# Patient Record
Sex: Male | Born: 1975 | Race: Black or African American | Hispanic: No | Marital: Married | Smoking: Never smoker
Health system: Southern US, Community
[De-identification: ages and names within clinical notes are randomized; demographics above are authoritative.]

## PROBLEM LIST (undated history)

## (undated) DIAGNOSIS — E119 Type 2 diabetes mellitus without complications: Secondary | ICD-10-CM

## (undated) DIAGNOSIS — I10 Essential (primary) hypertension: Secondary | ICD-10-CM

## (undated) DIAGNOSIS — T7840XA Allergy, unspecified, initial encounter: Secondary | ICD-10-CM

## (undated) DIAGNOSIS — E785 Hyperlipidemia, unspecified: Secondary | ICD-10-CM

## (undated) DIAGNOSIS — L109 Pemphigus, unspecified: Secondary | ICD-10-CM

## (undated) DIAGNOSIS — M722 Plantar fascial fibromatosis: Secondary | ICD-10-CM

## (undated) HISTORY — DX: Hyperlipidemia, unspecified: E78.5

## (undated) HISTORY — DX: Pemphigus, unspecified: L10.9

## (undated) HISTORY — PX: FINGER SURGERY: SHX640

## (undated) HISTORY — DX: Essential (primary) hypertension: I10

## (undated) HISTORY — DX: Plantar fascial fibromatosis: M72.2

## (undated) HISTORY — DX: Allergy, unspecified, initial encounter: T78.40XA

## (undated) HISTORY — DX: Type 2 diabetes mellitus without complications: E11.9

---

## 2008-12-11 ENCOUNTER — Ambulatory Visit: Payer: Self-pay | Admitting: Internal Medicine

## 2008-12-11 DIAGNOSIS — E663 Overweight: Secondary | ICD-10-CM

## 2008-12-11 DIAGNOSIS — R635 Abnormal weight gain: Secondary | ICD-10-CM | POA: Insufficient documentation

## 2008-12-11 LAB — CONVERTED CEMR LAB
ALT: 24 units/L (ref 0–53)
AST: 22 units/L (ref 0–37)
Alkaline Phosphatase: 68 units/L (ref 39–117)
Basophils Absolute: 0.1 10*3/uL (ref 0.0–0.1)
Calcium: 9.6 mg/dL (ref 8.4–10.5)
Eosinophils Relative: 1.3 % (ref 0.0–5.0)
GFR calc non Af Amer: 124.12 mL/min (ref >60)
Glucose, Bld: 94 mg/dL (ref 70–99)
HCT: 40.3 % (ref 39.0–52.0)
HDL: 38.3 mg/dL — ABNORMAL LOW (ref >39.00)
Hemoglobin: 13.1 g/dL (ref 13.0–17.0)
LDL Cholesterol: 126 mg/dL — ABNORMAL HIGH (ref 0–99)
Lymphocytes Relative: 23.9 % (ref 12.0–46.0)
Monocytes Relative: 12.8 % — ABNORMAL HIGH (ref 3.0–12.0)
Neutro Abs: 2.3 10*3/uL (ref 1.4–7.7)
Platelets: 289 10*3/uL (ref 150.0–400.0)
Potassium: 4.2 meq/L (ref 3.5–5.1)
RDW: 14.5 % (ref 11.5–14.6)
Sodium: 140 meq/L (ref 135–145)
Total Bilirubin: 1 mg/dL (ref 0.3–1.2)
VLDL: 8.4 mg/dL (ref 0.0–40.0)
WBC: 4 10*3/uL — ABNORMAL LOW (ref 4.5–10.5)

## 2008-12-12 ENCOUNTER — Encounter: Payer: Self-pay | Admitting: Internal Medicine

## 2009-01-21 ENCOUNTER — Ambulatory Visit: Payer: Self-pay | Admitting: Internal Medicine

## 2009-06-11 ENCOUNTER — Ambulatory Visit: Payer: Self-pay | Admitting: Internal Medicine

## 2009-06-11 DIAGNOSIS — J309 Allergic rhinitis, unspecified: Secondary | ICD-10-CM | POA: Insufficient documentation

## 2009-06-11 DIAGNOSIS — J019 Acute sinusitis, unspecified: Secondary | ICD-10-CM

## 2010-01-31 HISTORY — PX: FOOT SURGERY: SHX648

## 2010-03-02 NOTE — Assessment & Plan Note (Signed)
Summary: COUGH--MUCUS--HEADACHES  STC   Vital Signs:  Patient profile:   35 year old male Height:      72 inches Weight:      317 pounds BMI:     43.15 O2 Sat:      98 % on Room air Temp:     98.6 degrees F oral Pulse rate:   91 / minute Pulse rhythm:   regular Resp:     16 per minute BP sitting:   130 / 82  (left arm) Cuff size:   large  Vitals Entered By: Rock Nephew CMA (Jun 11, 2009 3:39 PM)  Nutrition Counseling: Patient's BMI is greater than 25 and therefore counseled on weight management options.  O2 Flow:  Room air CC: congestion, cough, headache, post nasal drip, URI symptoms   Primary Care Provider:  Etta Grandchild MD  CC:  congestion, cough, headache, post nasal drip, and URI symptoms.  History of Present Illness:  URI Symptoms      This is a 35 year old man who presents with URI symptoms.  The symptoms began 4-8 weeks ago.  The severity is described as moderate.  The patient reports nasal congestion, purulent nasal discharge, sore throat, and productive cough, but denies earache and sick contacts.  The patient denies fever, stiff neck, dyspnea, wheezing, rash, vomiting, diarrhea, use of an antipyretic, and response to antipyretic.  The patient also reports sneezing, seasonal symptoms, and response to antihistamine.  The patient denies headache, muscle aches, and severe fatigue.  Risk factors for Strep sinusitis include unilateral facial pain, unilateral nasal discharge, poor response to decongestant, and double sickening.  The patient denies the following risk factors for Strep sinusitis: Strep exposure, tender adenopathy, and absence of cough.    Preventive Screening-Counseling & Management  Alcohol-Tobacco     Alcohol drinks/day: 0     Smoking Status: never  Hep-HIV-STD-Contraception     Hepatitis Risk: no risk noted     HIV Risk: no risk noted     STD Risk: no risk noted      Sexual History:  currently monogamous.        Drug Use:  no.        Blood  Transfusions:  no.    Current Medications (verified): 1)  Avelox 400 Mg Tabs (Moxifloxacin Hcl) .... One By Mouth Once Daily For 10 Days 2)  Clarinex 5 Mg Tabs (Desloratadine) .... One By Mouth Once Daily For Nasal Allergies  Allergies (verified): 1)  ! Vicodin  Past History:  Past Medical History: Reviewed history from 12/11/2008 and no changes required. Plantar fasciitis Pemphigus  Past Surgical History: Reviewed history from 12/11/2008 and no changes required. Denies surgical history  Family History: Reviewed history from 12/11/2008 and no changes required. Family History Diabetes 1st degree relative Family History Hypertension  Social History: Reviewed history from 12/11/2008 and no changes required. Occupation: Psychologist, forensic and coach Married Never Smoked Alcohol use-no Drug use-no Regular exercise-no  Review of Systems  The patient denies anorexia, fever, weight loss, decreased hearing, chest pain, peripheral edema, headaches, hemoptysis, abdominal pain, difficulty walking, enlarged lymph nodes, and angioedema.    Physical Exam  General:  alert, well-developed, well-nourished, well-hydrated, appropriate dress, normal appearance, healthy-appearing, and cooperative to examination.   Head:  normocephalic, atraumatic, no abnormalities observed, and no abnormalities palpated.   Ears:  R ear normal and L ear normal.   Nose:  no external deformity, no airflow obstruction, no intranasal foreign body, no nasal  polyps, no nasal mucosal lesions, no mucosal friability, no active bleeding or clots, nasal dischargemucosal pallor, mucosal edema, L maxillary sinus tenderness, and R maxillary sinus tenderness.   Mouth:  good dentition, pharynx pink and moist, no erythema, no exudates, no posterior lymphoid hypertrophy, no postnasal drip, no pharyngeal crowing, no lesions, no aphthous ulcers, no erosions, and no tongue abnormalities.   Neck:  supple, full ROM, no masses, no  thyromegaly, no JVD, normal carotid upstroke, no cervical lymphadenopathy, and no neck tenderness.   Lungs:  normal respiratory effort, no intercostal retractions, no accessory muscle use, normal breath sounds, no dullness, no fremitus, no crackles, and no wheezes.   Heart:  normal rate, regular rhythm, no murmur, no gallop, no rub, and no JVD.   Abdomen:  soft, non-tender, normal bowel sounds, no distention, no masses, no guarding, no hepatomegaly, and no splenomegaly.   Msk:  normal ROM, no joint tenderness, no joint swelling, no joint warmth, no redness over joints, no joint deformities, no joint instability, and no crepitation.   Pulses:  R and L carotid,radial,femoral,dorsalis pedis and posterior tibial pulses are full and equal bilaterally Extremities:  No clubbing, cyanosis, edema, or deformity noted with normal full range of motion of all joints.   Neurologic:  No cranial nerve deficits noted. Station and gait are normal. Plantar reflexes are down-going bilaterally. DTRs are symmetrical throughout. Sensory, motor and coordinative functions appear intact. Skin:  turgor normal, color normal, no rashes, no suspicious lesions, no ecchymoses, no petechiae, no purpura, no ulcerations, and no edema.   Cervical Nodes:  no anterior cervical adenopathy and no posterior cervical adenopathy.   Axillary Nodes:  no R axillary adenopathy and no L axillary adenopathy.   Psych:  Cognition and judgment appear intact. Alert and cooperative with normal attention span and concentration. No apparent delusions, illusions, hallucinations   Impression & Recommendations:  Problem # 1:  RHINITIS (ICD-477.9) Assessment New  His updated medication list for this problem includes:    Clarinex 5 Mg Tabs (Desloratadine) ..... One by mouth once daily for nasal allergies  Discussed use of allergy medications and environmental measures.   Orders: Admin of Therapeutic Inj  intramuscular or subcutaneous (72536) Depo-  Medrol 80mg  (J1040) Depo- Medrol 40mg  (J1030)  Problem # 2:  SINUSITIS- ACUTE-NOS (ICD-461.9) Assessment: New  His updated medication list for this problem includes:    Avelox 400 Mg Tabs (Moxifloxacin hcl) ..... One by mouth once daily for 10 days  Instructed on treatment. Call if symptoms persist or worsen.   Complete Medication List: 1)  Avelox 400 Mg Tabs (Moxifloxacin hcl) .... One by mouth once daily for 10 days 2)  Clarinex 5 Mg Tabs (Desloratadine) .... One by mouth once daily for nasal allergies  Patient Instructions: 1)  Please schedule a follow-up appointment in 2 weeks. 2)  Take your antibiotic as prescribed until ALL of it is gone, but stop if you develop a rash or swelling and contact our office as soon as possible. 3)  Acute sinusitis symptoms for less than 10 days are not helped by antibiotics.Use warm moist compresses, and over the counter decongestants ( only as directed). Call if no improvement in 5-7 days, sooner if increasing pain, fever, or new symptoms. Prescriptions: CLARINEX 5 MG TABS (DESLORATADINE) One by mouth once daily for nasal allergies  #20 x 0   Entered and Authorized by:   Etta Grandchild MD   Signed by:   Etta Grandchild MD on 06/11/2009  Method used:   Samples Given   RxID:   [id number] AVELOX 400 MG TABS (MOXIFLOXACIN HCL) One by mouth once daily for 10 days  #10 x 0   Entered and Authorized by:   Etta Grandchild MD   Signed by:   Etta Grandchild MD on 06/11/2009   Method used:   Samples Given   RxID:   [id number]    Medication Administration  Injection # 1:    Medication: Depo- Medrol 40mg     Diagnosis: RHINITIS (ICD-477.9)    Route: IM    Site: R deltoid    Exp Date: 12/2011    Lot #: [id number]    Mfr: pfizer    Patient tolerated injection without complications    Given by: Rock Nephew CMA (Jun 11, 2009 5:11 PM)  Injection # 2:    Medication: Depo- Medrol 80mg     Diagnosis: RHINITIS (ICD-477.9)    Route: IM    Site:  R deltoid    Exp Date: 12/2011    Lot #: [id number]    Mfr: pfizer    Patient tolerated injection without complications    Given by: Rock Nephew CMA (Jun 11, 2009 5:11 PM)  Orders Added: 1)  Est. Patient Level IV [84696] 2)  Admin of Therapeutic Inj  intramuscular or subcutaneous [96372] 3)  Depo- Medrol 80mg  [J1040] 4)  Depo- Medrol 40mg  [J1030]

## 2010-06-17 ENCOUNTER — Ambulatory Visit: Payer: BC Managed Care – PPO | Attending: Podiatry | Admitting: Physical Therapy

## 2010-06-17 DIAGNOSIS — IMO0001 Reserved for inherently not codable concepts without codable children: Secondary | ICD-10-CM | POA: Insufficient documentation

## 2010-06-17 DIAGNOSIS — M25676 Stiffness of unspecified foot, not elsewhere classified: Secondary | ICD-10-CM | POA: Insufficient documentation

## 2010-06-17 DIAGNOSIS — M25673 Stiffness of unspecified ankle, not elsewhere classified: Secondary | ICD-10-CM | POA: Insufficient documentation

## 2010-06-17 DIAGNOSIS — R262 Difficulty in walking, not elsewhere classified: Secondary | ICD-10-CM | POA: Insufficient documentation

## 2010-06-17 DIAGNOSIS — M25579 Pain in unspecified ankle and joints of unspecified foot: Secondary | ICD-10-CM | POA: Insufficient documentation

## 2010-06-23 ENCOUNTER — Ambulatory Visit: Payer: BC Managed Care – PPO | Admitting: Physical Therapy

## 2010-06-29 ENCOUNTER — Ambulatory Visit: Payer: BC Managed Care – PPO | Admitting: Physical Therapy

## 2010-07-01 ENCOUNTER — Ambulatory Visit: Payer: BC Managed Care – PPO | Admitting: Physical Therapy

## 2010-07-06 ENCOUNTER — Ambulatory Visit: Payer: BC Managed Care – PPO | Attending: Podiatry | Admitting: Physical Therapy

## 2010-07-06 DIAGNOSIS — M25579 Pain in unspecified ankle and joints of unspecified foot: Secondary | ICD-10-CM | POA: Insufficient documentation

## 2010-07-06 DIAGNOSIS — R262 Difficulty in walking, not elsewhere classified: Secondary | ICD-10-CM | POA: Insufficient documentation

## 2010-07-06 DIAGNOSIS — IMO0001 Reserved for inherently not codable concepts without codable children: Secondary | ICD-10-CM | POA: Insufficient documentation

## 2010-07-06 DIAGNOSIS — M25673 Stiffness of unspecified ankle, not elsewhere classified: Secondary | ICD-10-CM | POA: Insufficient documentation

## 2010-07-06 DIAGNOSIS — M25676 Stiffness of unspecified foot, not elsewhere classified: Secondary | ICD-10-CM | POA: Insufficient documentation

## 2010-07-08 ENCOUNTER — Ambulatory Visit: Payer: BC Managed Care – PPO | Admitting: Physical Therapy

## 2010-07-12 ENCOUNTER — Encounter: Payer: BC Managed Care – PPO | Admitting: Physical Therapy

## 2010-07-14 ENCOUNTER — Encounter: Payer: BC Managed Care – PPO | Admitting: Physical Therapy

## 2010-07-19 ENCOUNTER — Ambulatory Visit: Payer: BC Managed Care – PPO | Admitting: Physical Therapy

## 2010-07-20 ENCOUNTER — Ambulatory Visit (INDEPENDENT_AMBULATORY_CARE_PROVIDER_SITE_OTHER): Payer: BC Managed Care – PPO | Admitting: Internal Medicine

## 2010-07-20 ENCOUNTER — Encounter: Payer: Self-pay | Admitting: Internal Medicine

## 2010-07-20 ENCOUNTER — Other Ambulatory Visit: Payer: Self-pay | Admitting: Internal Medicine

## 2010-07-20 ENCOUNTER — Other Ambulatory Visit (INDEPENDENT_AMBULATORY_CARE_PROVIDER_SITE_OTHER): Payer: BC Managed Care – PPO

## 2010-07-20 DIAGNOSIS — Z0000 Encounter for general adult medical examination without abnormal findings: Secondary | ICD-10-CM

## 2010-07-20 DIAGNOSIS — R635 Abnormal weight gain: Secondary | ICD-10-CM

## 2010-07-20 DIAGNOSIS — J309 Allergic rhinitis, unspecified: Secondary | ICD-10-CM

## 2010-07-20 DIAGNOSIS — Z23 Encounter for immunization: Secondary | ICD-10-CM

## 2010-07-20 LAB — CBC WITH DIFFERENTIAL/PLATELET
Basophils Absolute: 0 10*3/uL (ref 0.0–0.1)
Eosinophils Absolute: 0.1 10*3/uL (ref 0.0–0.7)
HCT: 42.2 % (ref 39.0–52.0)
Hemoglobin: 13.9 g/dL (ref 13.0–17.0)
Lymphs Abs: 1.7 10*3/uL (ref 0.7–4.0)
MCHC: 32.8 g/dL (ref 30.0–36.0)
Neutro Abs: 2.5 10*3/uL (ref 1.4–7.7)
RDW: 15.6 % — ABNORMAL HIGH (ref 11.5–14.6)

## 2010-07-20 LAB — COMPREHENSIVE METABOLIC PANEL
AST: 44 U/L — ABNORMAL HIGH (ref 0–37)
BUN: 20 mg/dL (ref 6–23)
Calcium: 9.6 mg/dL (ref 8.4–10.5)
Chloride: 106 mEq/L (ref 96–112)
Creatinine, Ser: 0.9 mg/dL (ref 0.4–1.5)
Glucose, Bld: 90 mg/dL (ref 70–99)

## 2010-07-20 LAB — LDL CHOLESTEROL, DIRECT: Direct LDL: 163.8 mg/dL

## 2010-07-20 LAB — LIPID PANEL: Cholesterol: 210 mg/dL — ABNORMAL HIGH (ref 0–200)

## 2010-07-20 LAB — TSH: TSH: 1.46 u[IU]/mL (ref 0.35–5.50)

## 2010-07-20 MED ORDER — DESLORATADINE 5 MG PO TABS: 5.0000 mg | ORAL_TABLET | Freq: Every day | ORAL | Status: DC

## 2010-07-20 NOTE — Progress Notes (Signed)
  Subjective:    Patient ID: Tyler Copeland, male    DOB: [birth year], 35 y.o.   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    Review of Systems  Constitutional: Positive for unexpected weight change (weight gain). Negative for fever, chills, diaphoresis, activity change, appetite change and fatigue.  HENT: Negative.   Eyes: Negative.   Respiratory: Negative.   Cardiovascular: Negative.   Gastrointestinal: Negative.   Genitourinary: Negative.   Musculoskeletal: Negative.   Skin: Negative.   Neurological: Negative.   Hematological: Negative.   Psychiatric/Behavioral: Negative.        Objective:   Physical Exam  Vitals reviewed. Constitutional: He is oriented to person, place, and time. He appears well-developed and well-nourished. No distress.  HENT:  Head: Normocephalic and atraumatic.  Right Ear: External ear normal.  Left Ear: External ear normal.  Nose: Nose normal.  Mouth/Throat: Oropharynx is clear and moist. No oropharyngeal exudate.  Eyes: Conjunctivae and EOM are normal. Pupils are equal, round, and reactive to light. Right eye exhibits no discharge. Left eye exhibits no discharge. No scleral icterus.  Neck: Normal range of motion. Neck supple. No JVD present. No tracheal deviation present. No thyromegaly present.  Cardiovascular: Normal rate, regular rhythm, normal heart sounds and intact distal pulses.  Exam reveals no gallop and no friction rub.   No murmur heard. Pulmonary/Chest: Effort normal and breath sounds normal. No stridor. No respiratory distress. He has no wheezes. He has no rales. He exhibits no tenderness.  Abdominal: Soft. Bowel sounds are normal. He exhibits no distension and no mass. There is no tenderness. There is no rebound and no guarding. Hernia confirmed negative in the right inguinal area and confirmed negative in the left inguinal area.  Genitourinary: Testes normal and  penis normal. Right testis shows no mass, no swelling and no tenderness. Left testis shows no mass, no swelling and no tenderness. Circumcised. No penile erythema or penile tenderness. No discharge found.  Musculoskeletal: Normal range of motion. He exhibits no edema and no tenderness.  Lymphadenopathy:    He has no cervical adenopathy.       Left: No inguinal adenopathy present.  Neurological: He is alert and oriented to person, place, and time. He has normal reflexes. No cranial nerve deficit. Coordination normal.  Skin: Skin is warm and dry. No rash noted. He is not diaphoretic. No erythema. No pallor.  Psychiatric: He has a normal mood and affect. His behavior is normal. Judgment and thought content normal.          Assessment & Plan:

## 2010-07-20 NOTE — Patient Instructions (Signed)
Health Maintenance in Males MAINTAIN REGULAR HEALTH EXAMS  Maintain a healthy diet and normal weight. Increased weight leads to problems with blood pressure and diabetes. Decrease fat in the diet and increase exercise. Obtain a proper diet from your caregiver if necessary.   High blood pressure causes heart and blood vessel problems. Check blood pressures regularly and keep your blood pressure at normal limits. Aerobic exercise helps this. Persistent elevations of blood pressure should be treated with medications if weight loss and exercise are ineffective.   Avoid smoking, drinking in excess (more than 2 drinks per day), or use of street drugs. Do not share needles with anyone. Ask for help if you need assistance or instructions on stopping the use of alcohol, cigarettes, or drugs.   Maintain normal blood lipids and cholesterol. Your caregiver can give you information to lower your risk of heart disease or stroke.   Ask your caregiver if you are in need of early heart disease screening because of a strong family history of heart disease or signs of elevated testosterone (male sex hormone) levels. These can predispose you to early heart disease.   Practice safe sex. Practicing safe sex decreases your risk for a sexually transmitted infection (STI). Some of the STIs are gonorrhea, chlamydia, syphilis, trichimonas, herpes, human papillomavirus (HPV), and human immunodeficiency virus (HIV). Herpes, HIV, and HPV are viral illnesses that have no cure. These can result in disability, cancer, and death.   It is not safe for someone who has AIDS or is HIV positive to have unprotected sex with a partner who is HIV positive. The reason for this is the fact that there are many different strains of HIV. If you have a strain that is readily treated with medications and then suddenly introduce a strain from a partner that has no further treatment options, you may suddenly have a strain of HIV that is untreatable.  Even if you are both positive for HIV, it is still necessary to practice safe sex.   Use sunscreen with a SPF of 15 or greater. Being outside in the sun when your shadow caused by the sun is shorter than you are, means you are being exposed to sun at greater intensity. Lighter skinned people are at a greater risk of skin cancer.   Keep carbon monoxide and smoke detectors in your home and functioning at all times. Change the batteries every 6 months.   Do monthly examinations of your testicles. The best time to do this is after a hot shower or bath when the tissues are loose. Notify your caregivers of any lumps, tenderness, or changes in size or shape.   Notify your caregiver of new moles or changes in moles, especially if there is a change in shape or color. Also notify your caregiver if a mole is larger than the size of a pencil eraser.   Stay current with your tetanus shots and other required immunizations.  The Body Mass Index (BMI) is a way of measuring how much of your body is fat. Having a BMI above 27 increases the risk of heart disease, diabetes, hypertension, stroke, and other problems related to obesity. Document Released: 07/16/2007 Document Re-Released: 07/07/2009 [employer] Patient Information 2011 [employer]. 

## 2010-07-20 NOTE — Assessment & Plan Note (Signed)
PPD placed, tdap given, labs ordered and I will check MMR status. He will work on weight loss. Pt ed material was given.

## 2010-07-21 ENCOUNTER — Ambulatory Visit: Payer: BC Managed Care – PPO | Admitting: Physical Therapy

## 2010-08-03 ENCOUNTER — Ambulatory Visit: Payer: BC Managed Care – PPO | Attending: Podiatry | Admitting: Physical Therapy

## 2010-08-03 DIAGNOSIS — M25676 Stiffness of unspecified foot, not elsewhere classified: Secondary | ICD-10-CM | POA: Insufficient documentation

## 2010-08-03 DIAGNOSIS — IMO0001 Reserved for inherently not codable concepts without codable children: Secondary | ICD-10-CM | POA: Insufficient documentation

## 2010-08-03 DIAGNOSIS — R262 Difficulty in walking, not elsewhere classified: Secondary | ICD-10-CM | POA: Insufficient documentation

## 2010-08-03 DIAGNOSIS — M25673 Stiffness of unspecified ankle, not elsewhere classified: Secondary | ICD-10-CM | POA: Insufficient documentation

## 2010-08-03 DIAGNOSIS — M25579 Pain in unspecified ankle and joints of unspecified foot: Secondary | ICD-10-CM | POA: Insufficient documentation

## 2010-08-05 ENCOUNTER — Encounter: Payer: BC Managed Care – PPO | Admitting: Physical Therapy

## 2010-08-09 ENCOUNTER — Ambulatory Visit: Payer: BC Managed Care – PPO | Admitting: Physical Therapy

## 2010-08-13 ENCOUNTER — Ambulatory Visit: Payer: BC Managed Care – PPO | Admitting: Physical Therapy

## 2010-08-16 ENCOUNTER — Ambulatory Visit: Payer: BC Managed Care – PPO | Admitting: Physical Therapy

## 2010-08-18 ENCOUNTER — Encounter: Payer: BC Managed Care – PPO | Admitting: Physical Therapy

## 2010-08-23 ENCOUNTER — Ambulatory Visit: Payer: BC Managed Care – PPO | Admitting: Physical Therapy

## 2010-08-25 ENCOUNTER — Ambulatory Visit (INDEPENDENT_AMBULATORY_CARE_PROVIDER_SITE_OTHER): Payer: BC Managed Care – PPO | Admitting: Internal Medicine

## 2010-08-25 ENCOUNTER — Encounter: Payer: Self-pay | Admitting: Internal Medicine

## 2010-08-25 ENCOUNTER — Telehealth: Payer: Self-pay | Admitting: *Deleted

## 2010-08-25 ENCOUNTER — Encounter: Payer: BC Managed Care – PPO | Admitting: Physical Therapy

## 2010-08-25 ENCOUNTER — Ambulatory Visit: Payer: BC Managed Care – PPO | Admitting: Internal Medicine

## 2010-08-25 VITALS — BP 134/90 | HR 80 | Temp 98.6°F | Resp 16 | Wt 340.0 lb

## 2010-08-25 DIAGNOSIS — I10 Essential (primary) hypertension: Secondary | ICD-10-CM

## 2010-08-25 DIAGNOSIS — E7800 Pure hypercholesterolemia, unspecified: Secondary | ICD-10-CM

## 2010-08-25 DIAGNOSIS — K1120 Sialoadenitis, unspecified: Secondary | ICD-10-CM

## 2010-08-25 MED ORDER — EZETIMIBE-SIMVASTATIN 10-20 MG PO TABS: 1.0000 | ORAL_TABLET | Freq: Every day | ORAL | Status: DC

## 2010-08-25 MED ORDER — OLMESARTAN MEDOXOMIL 20 MG PO TABS: 20.0000 mg | ORAL_TABLET | Freq: Every day | ORAL | Status: DC

## 2010-08-25 MED ORDER — AMOXICILLIN-POT CLAVULANATE 500-125 MG PO TABS: 1.0000 | ORAL_TABLET | Freq: Three times a day (TID) | ORAL | Status: AC

## 2010-08-25 NOTE — Assessment & Plan Note (Signed)
Start augmentin 

## 2010-08-25 NOTE — Telephone Encounter (Signed)
Cholesterol levels are high but blood sugar is well controlled, other labs look good

## 2010-08-25 NOTE — Progress Notes (Signed)
Subjective:    Patient ID: Tyler Copeland, male    DOB: 05/12/1975, 35 y.o.   MRN: [id number]  Hyperlipidemia This is a new problem. This is a new diagnosis. The problem is uncontrolled. Recent lipid tests were reviewed and are high. Exacerbating diseases include diabetes and obesity. He has no history of chronic renal disease, hypothyroidism, liver disease or nephrotic syndrome. Factors aggravating his hyperlipidemia include no known factors. Pertinent negatives include no chest pain, myalgias or shortness of breath. He is currently on no antihyperlipidemic treatment. Compliance problems include adherence to diet and adherence to exercise.   Hypertension This is a new problem. The current episode started today. The problem has been gradually worsening since onset. The problem is uncontrolled. Pertinent negatives include no anxiety, blurred vision, chest pain, headaches, malaise/fatigue, neck pain, orthopnea, palpitations, peripheral edema, PND, shortness of breath or sweats. Compliance problems include exercise and diet.  There is no history of chronic renal disease.  Diabetes He presents for his initial diabetic visit. He has type 2 diabetes mellitus. The initial diagnosis of diabetes was made 7 days ago. His disease course has been worsening. There are no hypoglycemic associated symptoms. Pertinent negatives for hypoglycemia include no dizziness, headaches, seizures, speech difficulty, sweats or tremors. Pertinent negatives for diabetes include no blurred vision, no chest pain, no fatigue, no foot paresthesias, no foot ulcerations, no polydipsia, no polyphagia, no polyuria, no visual change, no weakness and no weight loss. There are no hypoglycemic complications. Symptoms are stable. There are no diabetic complications. When asked about current treatments, none were reported. He is compliant with treatment none of the time. His weight is increasing steadily. He is following a generally unhealthy diet. He  has not had a previous visit with a dietician. He never participates in exercise. There is no change in his home blood glucose trend. An ACE inhibitor/angiotensin II receptor blocker is not being taken. He sees a podiatrist.Eye exam is current.  Also he has had right facial pain and swelling for 3 days that worsens when he eats or chews and stimulates his salivary gland.   Review of Systems  Constitutional: Negative for fever, chills, weight loss, malaise/fatigue, diaphoresis, activity change, appetite change, fatigue and unexpected weight change.  HENT: Positive for congestion, facial swelling (right side in front of his right ear), rhinorrhea, sneezing and postnasal drip. Negative for hearing loss, ear pain, nosebleeds, sore throat, drooling, mouth sores, trouble swallowing, neck pain, neck stiffness, dental problem, voice change, sinus pressure, tinnitus and ear discharge.   Eyes: Negative for blurred vision, photophobia, pain, discharge, redness, itching and visual disturbance.  Respiratory: Negative for apnea, cough, choking, chest tightness, shortness of breath, wheezing and stridor.   Cardiovascular: Negative for chest pain, palpitations, orthopnea, leg swelling and PND.  Gastrointestinal: Negative for nausea, vomiting, abdominal pain, diarrhea, constipation and anal bleeding.  Genitourinary: Negative for dysuria, urgency, polyuria, frequency, hematuria, flank pain, decreased urine volume, enuresis, difficulty urinating and genital sores.  Musculoskeletal: Negative for myalgias, back pain, joint swelling, arthralgias and gait problem.  Neurological: Negative for dizziness, tremors, seizures, syncope, facial asymmetry, speech difficulty, weakness, light-headedness, numbness and headaches.  Hematological: Negative for polydipsia, polyphagia and adenopathy. Does not bruise/bleed easily.  Psychiatric/Behavioral: Negative.        Objective:   Physical Exam  Vitals reviewed. Constitutional: He  is oriented to person, place, and time. He appears well-developed and well-nourished. No distress.  HENT:  Head: Atraumatic. Head is without raccoon's eyes, without Battle's sign, without abrasion, without contusion,  without laceration, without right periorbital erythema and without left periorbital erythema. Hair is normal.    Right Ear: External ear normal.  Left Ear: External ear normal.  Nose: Nose normal.  Mouth/Throat: Oropharynx is clear and moist. No oropharyngeal exudate.       He has mild swelling and ttp over his right parotid gland, there is no warmth, fluctuance, induration, erythema, streaking, mass, stones, and the skin is normal  Eyes: Conjunctivae and EOM are normal. Pupils are equal, round, and reactive to light. Right eye exhibits no discharge. Left eye exhibits no discharge. No scleral icterus.  Neck: Normal range of motion. Neck supple. No JVD present. No tracheal deviation present. No thyromegaly present.  Cardiovascular: Normal rate, regular rhythm, normal heart sounds and intact distal pulses.  Exam reveals no gallop and no friction rub.   No murmur heard. Pulmonary/Chest: Effort normal and breath sounds normal. No stridor. No respiratory distress. He has no wheezes. He has no rales. He exhibits no tenderness.  Abdominal: Soft. Bowel sounds are normal. He exhibits no distension and no mass. There is no tenderness. There is no rebound and no guarding.  Musculoskeletal: Normal range of motion. He exhibits no edema and no tenderness.  Lymphadenopathy:       Head (right side): No submental, no submandibular, no tonsillar, no preauricular, no posterior auricular and no occipital adenopathy present.       Head (left side): No submental, no submandibular, no tonsillar, no preauricular, no posterior auricular and no occipital adenopathy present.    He has no cervical adenopathy.       Right cervical: No superficial cervical, no deep cervical and no posterior cervical adenopathy  present.      Left cervical: No superficial cervical, no deep cervical and no posterior cervical adenopathy present.    He has no axillary adenopathy.       Right axillary: No pectoral and no lateral adenopathy present.       Left axillary: No pectoral and no lateral adenopathy present. Neurological: He is alert and oriented to person, place, and time. He has normal reflexes. He displays normal reflexes. No cranial nerve deficit. He exhibits normal muscle tone. Coordination normal.  Skin: Skin is warm and dry. No rash noted. He is not diaphoretic. No erythema. No pallor.  Psychiatric: He has a normal mood and affect. His behavior is normal. Judgment and thought content normal.        Lab Results  Component Value Date   WBC 5.4 07/20/2010   HGB 13.9 07/20/2010   HCT 42.2 07/20/2010   PLT 297.0 07/20/2010   CHOL 210* 07/20/2010   TRIG 58.0 07/20/2010   HDL 45.70 07/20/2010   LDLDIRECT 163.8 07/20/2010   ALT 50 07/20/2010   AST 44* 07/20/2010   NA 140 07/20/2010   K 4.5 07/20/2010   CL 106 07/20/2010   CREATININE 0.9 07/20/2010   BUN 20 07/20/2010   CO2 29 07/20/2010   TSH 1.46 07/20/2010   HGBA1C 6.2 07/20/2010    Assessment & Plan:

## 2010-08-25 NOTE — Assessment & Plan Note (Signed)
A1C= 6.2 so he does not need any meds yet but he will start lifestyle modifications and a 2400 cal diet, also will start an ARB and a statin

## 2010-08-25 NOTE — Assessment & Plan Note (Signed)
Start vytroin and lifestyle modifications

## 2010-08-25 NOTE — Telephone Encounter (Signed)
Left detailed vm for pt.

## 2010-08-25 NOTE — Assessment & Plan Note (Signed)
Start benicar and begin lifestyle modifications

## 2010-08-25 NOTE — Telephone Encounter (Signed)
Pt requesting lab results from June.

## 2010-08-25 NOTE — Patient Instructions (Signed)
Diabetes, Type 2 Diabetes is a lasting (chronic) disease. In type 2 diabetes, the pancreas does not make enough insulin (a hormone), and the body does not respond normally to the insulin that is made. This type of diabetes was also previously called adult onset diabetes. About 90% of all those who have diabetes have type 2. It usually occurs after the age of 36 but can occur at any age. CAUSES Unlike type 1 diabetes, which happens because insulin is no longer being made, type 2 diabetes happens because the body is making less insulin and has trouble using the insulin properly. SYMPTOMS  Drinking more than usual.   Urinating more than usual.   Blurred vision.   Dry, itchy skin.   Frequent infection like yeast infections in women.   More tired than usual (fatigue).  TREATMENT  Healthy eating.   Exercise.   Medication, if needed.   Monitoring blood glucose (sugar).   Seeing your caregiver regularly.  HOME CARE INSTRUCTIONS  Check your blood glucose (sugar) at least once daily. More frequent monitoring may be necessary, depending on your medications and on how well your diabetes is controlled. Your caregiver will advise you.   Take your medicine as directed by your caregiver.   Do not smoke.   Make wise food choices. Ask your caregiver for information. Weight loss can improve your diabetes.   Learn about low blood glucose (hypoglycemia) and how to treat it.   Get your eyes checked regularly.   Have a yearly physical exam. Have your blood pressure checked. Get your blood and urine tested.   Wear a pendant or bracelet saying that you have diabetes.   Check your feet every night for sores. Let your caregiver know if you have sores that are not healing.  SEEK MEDICAL CARE IF:  You are having problems keeping your blood glucose at target range.   You feel you might be having problems with your medicines.   You have symptoms of an illness that is not improving after 24  hours.   You have a sore or wound that is not healing.   You notice a change in vision or a new problem with your vision.  You develop a fever of more than 100.52400 Calorie Diabetic Diet The 2400 calorie diabetic diet is designed for eating up to 2400 calories each day. Following this diet and making healthy meal choices can help improve overall health. It controls blood sugar levels, and can also lower blood pressure and cholesterol.  SERVING SIZES: Measuring foods and serving sizes helps to make sure you are getting the right amount of food. The list below tells how big or small some common serving sizes are. 1 ounce (oz.)................4 stacked dice.  3 oz.............................Marland KitchenDeck of cards.  1 teaspoon (tsp) .........Marland KitchenTip of little finger.  1 tablespoon (Tbsp)...Marland KitchenMarland KitchenThumb.  2 Tbsp.........................Marland KitchenGolf ball.   Cup.........................Marland KitchenHalf of a fist.  1 Cup..........................Marland KitchenA fist.  GUIDELINES FOR CHOOSING FOODS: The goal of this diet is to eat a variety of foods and limit calories to 2400 each day. This can be done by choosing foods that are low in calories and in fat. The diet also suggests eating small amounts of food often. Doing this helps control your blood sugar levels so they do not get too high or low. Each meal or snack should contain a protein food source to help you feel more satisfied and to stabilize your blood sugar. Try to eat about the same amount of food around the same time  each day. This includes weekend days, travel days and days off work. Space your meals about 4-5 hours apart and add a snack between them if you wish.  For example, a daily food plan could include breakfast, a morning snack, lunch, dinner and an evening snack. Healthy meals and snacks have a variety of foods including whole grains, vegetables, fruits, lean meats, poultry, fish and dairy products. As you plan your meals, select a variety of foods. Choose from the bread and  starch, vegetable, fruit, dairy and meat/protein groups. Examples of foods from each group are listed below with their suggested serving sizes. Use measuring cups and spoons to become familiar with what a healthy portion looks like. Bread and Starch (each serving equals 15 grams of carbohydrate) 1 slice bread.  1/4 bagel.   cup cold cereal (unsweetened).   cup hot cereal, cooked pasta or mashed potatoes.  1 small potato (size of a computer mouse).  1/3 cup cooked pasta or rice.   English muffin.  1 cup broth based soup.  3 cups of popcorn.  4-6 whole wheat crackers.   cup cooked beans, peas or corn.  Vegetable (each serving equals 5 grams of carbohydrate)  cup cooked vegetables.  1 cup raw vegetables  1/2 cup tomato juice.   Fruit (each serving equals 15 grams of carbohydrate)  1 small apple or orange.  1 cup watermelon or strawberries.   cup applesauce (no sugar added).  2 Tbsp raisins.   banana.   cup unsweetened canned fruit.   cup unsweetened fruit juice.  Dairy (each serving equals 12-15 grams of carbohydrate) 1 cup fat free milk.  6 oz artificially sweetened yogurt.  1 cup buttermilk  1 cup soy milk   Meat/Protein 1 large egg.  2-3 oz meat, poultry or fish.   cup cottage cheese.  1 Tbsp peanut butter.   cup tofu.  1 oz cheese.   cup canned tuna in water.   SAMPLE 2400 CALORIE DIET PLAN: Breakfast: 1 english muffin (2 carb servings). 1 scrambled egg.  2 tsp margarine. Fat free milk, 1 cup (1 carb serving).  1 large orange (2 carb servings).   Morning Snack: Low fat cottage cheese, 1/4 cup.  cup canned peaches in juice (1 carb serving).  Carrot sticks, 1 cup. 5 whole wheat melba toasts (1 carb serving).   Lunch: Grilled chicken salad. 2 oz chicken breast.  Romaine lettuce or spinach, 1 cup.  Diced tomato,  cup.  Shredded carrots,  cup.  Sliced cucumbers,  cup.  Low fat salad dressing, 2 Tbsp.  Whole wheat bread, 2 slices (2 carb servings). 1  small apple (1 carb serving).  Fat free milk, 1 cup (1 carb serving).  15 baked chips ( 1 carb serving).   Afternoon Snack: 8 reduced fat triscuits (2 carb servings). Peanut butter, 2 Tbsp.   Dinner: 3 oz salmon, broiled. 4.5 small red potatoes, roasted with 1 tsp olive oil and seasoning (3 carb servings).  Green beans, 1 cup. Strawberries, 1  cup (1 carb serving).  Fat free milk, 1 cup (1 carb serving).   Evening Snack: 6 cups air popped popcorn (2 carb servings). 2 Tbsp parmesan cheese sprinkled on top.  8 almonds.   Meal Plan You can use this worksheet to help you make a daily meal plan based on the 2400 calorie diabetic diet suggestions. If you are using this plan to help you control your blood glucose, you may interchange carbohydrate containing foods (dairy, starches,  and fruits). Select a variety of fresh foods of varying colors and flavors. The total amount of carbohydrate in your meal or snack is more important than making sure you include all of the food groups every time you eat. Remember that you should choose the following foods for a day's meals: 13 Starches. 4 Vegetables.  4 Fruits.  3 Dairy.  8 oz Meat.  Up to 8 Fats.   Your dietician can use this worksheet to help you decide how many servings and what types of foods are right for you. Breakfast Food Group and Servings Food Choice Starches ________________________________________________________ Dairy __________________________________________________________ Fruit ___________________________________________________________ Meat ___________________________________________________________ Fat_________________________________________________________ Serina Cowper Food Group and Servings Food Choice  Starches _________________________________________________________ Meat __________________________________________________________ Vegetables _____________________________________________________ Fruit  __________________________________________________________ Dairy _________________________________________________________ Fat________________________________________________________ Afternoon Snack Food Group and Servings Food Choice Starch ____________________________________________________ Meat_____________________________________________________ Laural Golden Food Group and Servings Food Choice Starches _______________________________________________________ Meat __________________________________________________________ Dairy _________________________________________________________ Vegetable ______________________________________________________ Fruit __________________________________________________________ Fat_______________________________________________________ Evening Snack Food Group and Servings Food Choice Fruit __________________________________________________________ Meat __________________________________________________________ Starches _______________________________________________________ Daily Totals Starches _________________________ Vegetables _________________________ Fruits _____________________________ Dairy _____________________________ Meat ______________________________ Fats _______________________________  Document Released: 08/09/2004 Document Re-Released: 07/07/2009 [employer] Patient Information 2011 [place], [place].Hypercholesterolemia High Blood Cholesterol Cholesterol is a white, waxy, fat-like protein needed by your body in small amounts. The liver makes all the cholesterol you need. It is carried from the liver by the blood through the blood vessels. Deposits (plaque) may build up on blood vessel walls. This makes the arteries narrower and stiffer. Plaque increases the risk for heart attack and stroke. You cannot feel your cholesterol level even if it is very high. The only way to know is by a blood test to check your lipid (fats) levels. Once you know  your cholesterol levels, you should keep a record of the test results. Work with your caregiver to to keep your levels in the desired range. WHAT THE RESULTS MEAN: Total cholesterol is a rough measure of all the cholesterol in your blood.  LDL is the so-called bad cholesterol. This is the type that deposits cholesterol in the walls of the arteries. You want this level to be low.  HDL is the good cholesterol because it cleans the arteries and carries the LDL away. You want this level to be high.  Triglycerides are fat that the body can either burn for energy or store. High levels are closely linked to heart disease.  DESIRED LEVELS: Total cholesterol below 200.  LDL below 100 for people at risk, below 70 for very high risk.  HDL above 50 is good, above 60 is best.  Triglycerides below 150.  HOW TO LOWER YOUR CHOLESTEROL: Diet.  Choose fish or white meat chicken and [place], roasted or baked. Limit fatty cuts of red meat, fried foods, and processed meats, such as sausage and lunch meat.  Eat lots of fresh fruits and vegetables. Choose whole grains, beans, pasta, potatoes and cereals.  Use only small amounts of olive, corn or canola oils. Avoid butter, mayonnaise, shortening or palm kernel oils. Avoid foods with trans-fats.  Use skim/nonfat milk and low-fat/nonfat yogurt and cheeses. Avoid whole milk, cream, ice cream, egg yolks and cheeses. Healthy desserts include angel food cake, gingersnaps, animal crackers, hard candy, popsicles, and low-fat/nonfat frozen yogurt. Avoid pastries, cakes, pies and cookies.  Exercise.  A regular program helps decrease LDL and raises HDL.  Helps with weight control.  Do things that increase your activity level like gardening, walking, or taking the stairs.  Medication.  May be prescribed by  your caregiver to help lowering cholesterol and the risk for heart disease.  You may need medicine even if your levels are normal if you have several risk factors.  HOME CARE  INSTRUCTIONS Follow your diet and exercise programs as suggested by your caregiver.  Take medications as directed.  Have blood work done when your caregiver feels it is necessary.  MAKE SURE YOU:  Understand these instructions.  Will watch your condition.  Will get help right away if you are not doing well or get worse.  Document Released: 01/17/2005 Document Re-Released: 12/31/2007 [facility] Patient Information 2011 [place], [place].Hypertension (High Blood Pressure) As your heart beats, it forces blood through your arteries. This force is your blood pressure. If the pressure is too high, it is called hypertension (HTN) or high blood pressure. HTN is dangerous because you may have it and not know it. High blood pressure may mean that your heart has to work harder to pump blood. Your arteries may be narrow or stiff. The extra work puts you at risk for heart disease, stroke, and other problems.  Blood pressure consists of two numbers, a higher number over a lower, 110/72, for example. It is stated as "110 over 72." The ideal is below 120 for the top number (systolic) and under 80 for the bottom (diastolic). Write down your blood pressure today. You should pay close attention to your blood pressure if you have certain conditions such as: Heart failure. Prior heart attack.  Diabetes  Chronic kidney disease.  Prior stroke.  Multiple risk factors for heart disease.   To see if you have HTN, your blood pressure should be measured while you are seated with your arm held at the level of the heart. It should be measured at least twice. A one-time elevated blood pressure reading (especially in the Emergency Department) does not mean that you need treatment. There may be conditions in which the blood pressure is different between your right and left arms. It is important to see your caregiver soon for a recheck. Most people have essential hypertension which means that there is not a specific cause. This type  of high blood pressure may be lowered by changing lifestyle factors such as: Stress. Smoking.  Lack of exercise.  Excessive weight. Drug/tobacco/alcohol use.  Eating less salt.   Most people do not have symptoms from high blood pressure until it has caused damage to the body. Effective treatment can often prevent, delay or reduce that damage. TREATMENT Treatment for high blood pressure, when a cause has been identified, is directed at the cause. There are a large number of medications to treat HTN. These fall into several categories, and your caregiver will help you select the medicines that are best for you. Medications may have side effects. You should review side effects with your caregiver. If your blood pressure stays high after you have made lifestyle changes or started on medicines,  Your medication(s) may need to be changed.  Other problems may need to be addressed.  Be certain you understand your prescriptions, and know how and when to take your medicine.  Be sure to follow up with your caregiver within the time frame advised (usually within two weeks) to have your blood pressure rechecked and to review your medications.  If you are taking more than one medicine to lower your blood pressure, make sure you know how and at what times they should be taken. Taking two medicines at the same time can result in blood pressure that  is too low.  SEEK IMMEDIATE MEDICAL CARE IF YOU DEVELOP: A severe headache, blurred or changing vision, or confusion.  Unusual weakness or numbness, or a faint feeling.  Severe chest or abdominal pain, vomiting, or breathing problems.  MAKE SURE YOU:  Understand these instructions.  Will watch your condition.  Will get help right away if you are not doing well or get worse.  Document Released: 01/17/2005 Document Re-Released: 07/07/2009  [facility] Patient Information 2011 [place], [place]..  Document Released: 01/17/2005 Document Re-Released: 02/08/2009 [facility]  Patient Information 2011 [place], [place].

## 2010-09-02 ENCOUNTER — Ambulatory Visit: Payer: BC Managed Care – PPO | Attending: Podiatry | Admitting: Physical Therapy

## 2010-09-02 DIAGNOSIS — R262 Difficulty in walking, not elsewhere classified: Secondary | ICD-10-CM | POA: Insufficient documentation

## 2010-09-02 DIAGNOSIS — M25673 Stiffness of unspecified ankle, not elsewhere classified: Secondary | ICD-10-CM | POA: Insufficient documentation

## 2010-09-02 DIAGNOSIS — M25676 Stiffness of unspecified foot, not elsewhere classified: Secondary | ICD-10-CM | POA: Insufficient documentation

## 2010-09-02 DIAGNOSIS — M25579 Pain in unspecified ankle and joints of unspecified foot: Secondary | ICD-10-CM | POA: Insufficient documentation

## 2010-09-02 DIAGNOSIS — IMO0001 Reserved for inherently not codable concepts without codable children: Secondary | ICD-10-CM | POA: Insufficient documentation

## 2010-10-22 ENCOUNTER — Ambulatory Visit: Payer: BC Managed Care – PPO | Admitting: Internal Medicine

## 2010-10-22 DIAGNOSIS — Z0289 Encounter for other administrative examinations: Secondary | ICD-10-CM

## 2010-11-05 ENCOUNTER — Ambulatory Visit (INDEPENDENT_AMBULATORY_CARE_PROVIDER_SITE_OTHER): Payer: BC Managed Care – PPO | Admitting: Internal Medicine

## 2010-11-05 ENCOUNTER — Encounter: Payer: Self-pay | Admitting: Internal Medicine

## 2010-11-05 ENCOUNTER — Other Ambulatory Visit (INDEPENDENT_AMBULATORY_CARE_PROVIDER_SITE_OTHER): Payer: BC Managed Care – PPO

## 2010-11-05 ENCOUNTER — Telehealth: Payer: Self-pay

## 2010-11-05 VITALS — BP 120/82 | HR 77 | Temp 98.7°F | Resp 16 | Wt 323.0 lb

## 2010-11-05 DIAGNOSIS — E7800 Pure hypercholesterolemia, unspecified: Secondary | ICD-10-CM

## 2010-11-05 DIAGNOSIS — Z23 Encounter for immunization: Secondary | ICD-10-CM

## 2010-11-05 DIAGNOSIS — J309 Allergic rhinitis, unspecified: Secondary | ICD-10-CM | POA: Insufficient documentation

## 2010-11-05 DIAGNOSIS — I10 Essential (primary) hypertension: Secondary | ICD-10-CM

## 2010-11-05 DIAGNOSIS — J019 Acute sinusitis, unspecified: Secondary | ICD-10-CM

## 2010-11-05 LAB — COMPREHENSIVE METABOLIC PANEL
ALT: 35 U/L (ref 0–53)
Albumin: 3.9 g/dL (ref 3.5–5.2)
Alkaline Phosphatase: 69 U/L (ref 39–117)
CO2: 27 mEq/L (ref 19–32)
Potassium: 3.9 mEq/L (ref 3.5–5.1)
Sodium: 140 mEq/L (ref 135–145)
Total Bilirubin: 0.2 mg/dL — ABNORMAL LOW (ref 0.3–1.2)
Total Protein: 7.9 g/dL (ref 6.0–8.3)

## 2010-11-05 LAB — LIPID PANEL
HDL: 40.9 mg/dL (ref >39.00)
LDL Cholesterol: 72 mg/dL (ref 0–99)
VLDL: 7.8 mg/dL (ref 0.0–40.0)

## 2010-11-05 LAB — CBC WITH DIFFERENTIAL/PLATELET
Eosinophils Absolute: 0.1 10*3/uL (ref 0.0–0.7)
Eosinophils Relative: 2.5 % (ref 0.0–5.0)
Lymphocytes Relative: 27.1 % (ref 12.0–46.0)
MCV: 78.4 fl (ref 78.0–100.0)
Monocytes Absolute: 0.9 10*3/uL (ref 0.1–1.0)
Neutrophils Relative %: 52.2 % (ref 43.0–77.0)
Platelets: 286 10*3/uL (ref 150.0–400.0)
RBC: 5.23 Mil/uL (ref 4.22–5.81)
WBC: 5.1 10*3/uL (ref 4.5–10.5)

## 2010-11-05 LAB — HEMOGLOBIN A1C: Hgb A1c MFr Bld: 6.8 % — ABNORMAL HIGH (ref 4.6–6.5)

## 2010-11-05 MED ORDER — EZETIMIBE-SIMVASTATIN 10-40 MG PO TABS: 1.0000 | ORAL_TABLET | Freq: Every day | ORAL | Status: DC

## 2010-11-05 MED ORDER — OLMESARTAN MEDOXOMIL 20 MG PO TABS: 20.0000 mg | ORAL_TABLET | Freq: Every day | ORAL | Status: DC

## 2010-11-05 MED ORDER — CEFUROXIME AXETIL 500 MG PO TABS: 500.0000 mg | ORAL_TABLET | Freq: Two times a day (BID) | ORAL | Status: AC

## 2010-11-05 NOTE — Assessment & Plan Note (Signed)
Start ceftin for what appears to be a bacterial infection

## 2010-11-05 NOTE — Assessment & Plan Note (Signed)
His BP is well controlled, I will check his lytes and renal function today 

## 2010-11-05 NOTE — Progress Notes (Signed)
Subjective:    Patient ID: Tyler Copeland, male    DOB: 06-14-1975, 35 y.o.   MRN: [id number]  Sinusitis This is a new problem. Episode onset: 2 weeks. The problem has been gradually worsening since onset. There has been no fever. His pain is at a severity of 0/10. He is experiencing no pain. Associated symptoms include congestion, coughing (productive of yellow phlegm) and sinus pressure. Pertinent negatives include no chills, diaphoresis, ear pain, headaches, hoarse voice, neck pain, shortness of breath, sore throat or swollen glands. Past treatments include nothing.  Diabetes He presents for his follow-up diabetic visit. He has type 2 diabetes mellitus. His disease course has been stable. There are no hypoglycemic associated symptoms. Pertinent negatives for hypoglycemia include no dizziness, headaches, pallor, seizures, speech difficulty, sweats or tremors. Pertinent negatives for diabetes include no blurred vision, no chest pain, no fatigue, no foot paresthesias, no foot ulcerations, no polydipsia, no polyphagia, no polyuria, no visual change, no weakness and no weight loss. There are no hypoglycemic complications. There are no diabetic complications. Current diabetic treatment includes diet. He is compliant with treatment all of the time. His weight is stable. He is following a generally healthy diet. Meal planning includes avoidance of concentrated sweets. He participates in exercise intermittently. There is no change in his home blood glucose trend. An ACE inhibitor/angiotensin II receptor blocker is being taken. He does not see a podiatrist.Eye exam is current.  Hyperlipidemia This is a chronic problem. The current episode started more than 1 year ago. The problem is controlled. Recent lipid tests were reviewed and are variable. Exacerbating diseases include diabetes and obesity. He has no history of chronic renal disease, hypothyroidism, liver disease or nephrotic syndrome. Factors aggravating his  hyperlipidemia include no known factors. Pertinent negatives include no chest pain, focal sensory loss, focal weakness, leg pain, myalgias or shortness of breath. Current antihyperlipidemic treatment includes statins and ezetimibe. The current treatment provides moderate improvement of lipids. There are no compliance problems.   Hypertension This is a chronic problem. The current episode started more than 1 year ago. The problem has been gradually improving since onset. The problem is controlled. Pertinent negatives include no anxiety, blurred vision, chest pain, headaches, malaise/fatigue, neck pain, orthopnea, palpitations, peripheral edema, PND, shortness of breath or sweats. Past treatments include angiotensin blockers. The current treatment provides significant improvement. There are no compliance problems.  There is no history of chronic renal disease.      Review of Systems  Constitutional: Negative for fever, chills, weight loss, malaise/fatigue, diaphoresis, activity change, appetite change, fatigue and unexpected weight change.  HENT: Positive for congestion, rhinorrhea, postnasal drip and sinus pressure. Negative for hearing loss, ear pain, nosebleeds, sore throat, hoarse voice, facial swelling, trouble swallowing, neck pain, neck stiffness, voice change, tinnitus and ear discharge.   Eyes: Negative.  Negative for blurred vision.  Respiratory: Positive for cough (productive of yellow phlegm). Negative for apnea, choking, chest tightness, shortness of breath, wheezing and stridor.   Cardiovascular: Negative for chest pain, palpitations, orthopnea, leg swelling and PND.  Gastrointestinal: Negative for nausea, abdominal pain, diarrhea, constipation, blood in stool, abdominal distention, anal bleeding and rectal pain.  Genitourinary: Negative.  Negative for polyuria.  Musculoskeletal: Negative for myalgias, back pain, joint swelling, arthralgias and gait problem.  Skin: Negative for color  change, pallor, rash and wound.  Neurological: Negative for dizziness, tremors, focal weakness, seizures, syncope, facial asymmetry, speech difficulty, weakness, light-headedness, numbness and headaches.  Hematological: Negative for polydipsia, polyphagia and adenopathy.  Does not bruise/bleed easily.  Psychiatric/Behavioral: Negative.        Objective:   Physical Exam  Vitals reviewed. Constitutional: He is oriented to person, place, and time. He appears well-developed and well-nourished. No distress.  HENT:  Head: Normocephalic and atraumatic. No trismus in the jaw.  Right Ear: Hearing, tympanic membrane, external ear and ear canal normal.  Left Ear: Hearing, tympanic membrane, external ear and ear canal normal.  Nose: Mucosal edema and rhinorrhea present. No nose lacerations, sinus tenderness, nasal deformity, septal deviation or nasal septal hematoma. No epistaxis.  No foreign bodies. Right sinus exhibits maxillary sinus tenderness. Right sinus exhibits no frontal sinus tenderness. Left sinus exhibits maxillary sinus tenderness. Left sinus exhibits no frontal sinus tenderness.  Mouth/Throat: Oropharynx is clear and moist and mucous membranes are normal. Mucous membranes are not pale, not dry and not cyanotic. No uvula swelling. No oropharyngeal exudate, posterior oropharyngeal edema, posterior oropharyngeal erythema or tonsillar abscesses.  Eyes: Conjunctivae are normal. Right eye exhibits no discharge. Left eye exhibits no discharge. No scleral icterus.  Neck: Normal range of motion. Neck supple. No JVD present. No tracheal deviation present. No thyromegaly present.  Cardiovascular: Normal rate, regular rhythm, normal heart sounds and intact distal pulses.  Exam reveals no gallop and no friction rub.   No murmur heard. Pulmonary/Chest: Effort normal and breath sounds normal. No stridor. No respiratory distress. He has no wheezes. He has no rales. He exhibits no tenderness.  Abdominal: Soft.  Bowel sounds are normal. He exhibits no distension and no mass. There is no tenderness. There is no rebound and no guarding.  Musculoskeletal: Normal range of motion. He exhibits no edema and no tenderness.  Lymphadenopathy:    He has no cervical adenopathy.  Neurological: He is oriented to person, place, and time. He displays normal reflexes. No cranial nerve deficit. He exhibits normal muscle tone. Coordination normal.  Skin: Skin is warm and dry. No rash noted. He is not diaphoretic. No erythema. No pallor.  Psychiatric: He has a normal mood and affect. His behavior is normal. Judgment and thought content normal.     Lab Results  Component Value Date   WBC 5.4 07/20/2010   HGB 13.9 07/20/2010   HCT 42.2 07/20/2010   PLT 297.0 07/20/2010   GLUCOSE 90 07/20/2010   CHOL 210* 07/20/2010   TRIG 58.0 07/20/2010   HDL 45.70 07/20/2010   LDLDIRECT 163.8 07/20/2010   LDLCALC 126* 12/11/2008   ALT 50 07/20/2010   AST 44* 07/20/2010   NA 140 07/20/2010   K 4.5 07/20/2010   CL 106 07/20/2010   CREATININE 0.9 07/20/2010   BUN 20 07/20/2010   CO2 29 07/20/2010   TSH 1.46 07/20/2010   HGBA1C 6.2 07/20/2010       Assessment & Plan:

## 2010-11-05 NOTE — Assessment & Plan Note (Signed)
I will increase the dose of vytorin to 10/40

## 2010-11-05 NOTE — Assessment & Plan Note (Signed)
Try depo-medrol IM for symptoms relief

## 2010-11-05 NOTE — Assessment & Plan Note (Signed)
I will check his A1C and will monitor his renal function today 

## 2010-11-05 NOTE — Patient Instructions (Signed)
Diabetes, Type 2 Diabetes is a lasting (chronic) disease. In type 2 diabetes, the pancreas does not make enough insulin (a hormone), and the body does not respond normally to the insulin that is made. This type of diabetes was also previously called adult onset diabetes. About 90% of all those who have diabetes have type 2. It usually occurs after the age of 40 but can occur at any age. CAUSES Unlike type 1 diabetes, which happens because insulin is no longer being made, type 2 diabetes happens because the body is making less insulin and has trouble using the insulin properly. SYMPTOMS  Drinking more than usual.   Urinating more than usual.   Blurred vision.   Dry, itchy skin.   Frequent infection like yeast infections in women.   More tired than usual (fatigue).  TREATMENT  Healthy eating.   Exercise.   Medication, if needed.   Monitoring blood glucose (sugar).   Seeing your caregiver regularly.  HOME CARE INSTRUCTIONS  Check your blood glucose (sugar) at least once daily. More frequent monitoring may be necessary, depending on your medications and on how well your diabetes is controlled. Your caregiver will advise you.   Take your medicine as directed by your caregiver.   Do not smoke.   Make wise food choices. Ask your caregiver for information. Weight loss can improve your diabetes.   Learn about low blood glucose (hypoglycemia) and how to treat it.   Get your eyes checked regularly.   Have a yearly physical exam. Have your blood pressure checked. Get your blood and urine tested.   Wear a pendant or bracelet saying that you have diabetes.   Check your feet every night for sores. Let your caregiver know if you have sores that are not healing.  SEEK MEDICAL CARE IF:  You are having problems keeping your blood glucose at target range.   You feel you might be having problems with your medicines.   You have symptoms of an illness that is not improving after 24  hours.   You have a sore or wound that is not healing.   You notice a change in vision or a new problem with your vision.   You develop a fever of more than 100.5.  Document Released: 01/17/2005 Document Re-Released: 02/08/2009 [employer] Patient Information 2011 [employer].Sinusitis Sinuses are air pockets within the bones of your face. The growth of bacteria within a sinus leads to infection. Infection keeps the sinuses from draining. This infection is called sinusitis. SYMPTOMS There will be different areas of pain depending on which sinuses have become infected.  The maxillary sinuses often produce pain beneath the eyes.   Frontal sinusitis may cause pain in the middle of the forehead and above the eyes.  Other problems (symptoms) include:  Toothaches.   Colored, pus-like (purulent) drainage from the nose.   Any swelling, warmth, or tenderness over the sinus areas may be signs of infection.  TREATMENT Sinusitis is most often determined by an exam and you may have x-rays taken. If x-rays have been taken, make sure you obtain your results. Or find out how you are to obtain them. Your caregiver may give you medications (antibiotics). These are medications that will help kill the infection. You may also be given a medication (decongestant) that helps to reduce sinus swelling.  HOME CARE INSTRUCTIONS  Only take over-the-counter or prescription medicines for pain, discomfort, or fever as directed by your caregiver.   Drink extra fluids. Fluids help thin   the mucus so your sinuses can drain more easily.   Applying either moist heat or ice packs to the sinus areas may help relieve discomfort.   Use saline nasal sprays to help moisten your sinuses. The sprays can be found at your local drugstore.  SEEK IMMEDIATE MEDICAL CARE IF YOU DEVELOP:  High fever that is still present after two days of antibiotic treatment.   Increasing pain, severe headaches, or toothache.   Nausea,  vomiting, or drowsiness.   Unusual swelling around the face or trouble seeing.  MAKE SURE YOU:   Understand these instructions.   Will watch your condition.   Will get help right away if you are not doing well or get worse.  Document Released: 01/17/2005 Document Re-Released: 12/31/2007 [employer] Patient Information 2011 [employer]. 

## 2010-11-05 NOTE — Telephone Encounter (Signed)
Insurance will not cover Benicar without PA. Formulary alturn are diovan, micardis, or losartan. Please advise

## 2010-11-25 ENCOUNTER — Ambulatory Visit: Payer: BC Managed Care – PPO | Admitting: Internal Medicine

## 2011-01-05 ENCOUNTER — Other Ambulatory Visit: Payer: Self-pay | Admitting: Internal Medicine

## 2011-01-05 ENCOUNTER — Encounter: Payer: Self-pay | Admitting: Internal Medicine

## 2011-01-05 ENCOUNTER — Other Ambulatory Visit: Payer: BC Managed Care – PPO

## 2011-01-05 ENCOUNTER — Ambulatory Visit (INDEPENDENT_AMBULATORY_CARE_PROVIDER_SITE_OTHER): Payer: BC Managed Care – PPO | Admitting: Internal Medicine

## 2011-01-05 VITALS — BP 134/82 | HR 80 | Temp 99.6°F | Resp 16 | Wt 338.0 lb

## 2011-01-05 DIAGNOSIS — IMO0001 Reserved for inherently not codable concepts without codable children: Secondary | ICD-10-CM

## 2011-01-05 DIAGNOSIS — L03012 Cellulitis of left finger: Secondary | ICD-10-CM | POA: Insufficient documentation

## 2011-01-05 DIAGNOSIS — L03019 Cellulitis of unspecified finger: Secondary | ICD-10-CM

## 2011-01-05 MED ORDER — SULFAMETHOXAZOLE-TRIMETHOPRIM 800-160 MG PO TABS: 1.0000 | ORAL_TABLET | Freq: Two times a day (BID) | ORAL | Status: AC

## 2011-01-05 NOTE — Progress Notes (Signed)
  Subjective:    Patient ID: Tyler Copeland, male    DOB: [birth year], 35 y.o.   MRN: [id number]  HPI Pain and swelling in left middle finger for 5 days. No trauma or injury.   Review of Systems  Constitutional: Negative.   HENT: Negative.   Eyes: Negative.   Respiratory: Negative.   Cardiovascular: Negative.   Gastrointestinal: Negative.   Genitourinary: Negative.   Musculoskeletal: Negative.   Skin: Negative.   Neurological: Negative.   Hematological: Negative.   Psychiatric/Behavioral: Negative.        Objective:   Physical Exam  Vitals reviewed. Constitutional: He is oriented to person, place, and time. He appears well-developed and well-nourished. No distress.  HENT:  Head: Normocephalic and atraumatic.  Mouth/Throat: No oropharyngeal exudate.  Eyes: Conjunctivae are normal.  Neck: Normal range of motion. Neck supple. No JVD present. No tracheal deviation present. No thyromegaly present.  Cardiovascular: Normal rate and regular rhythm.   Pulmonary/Chest: Effort normal and breath sounds normal.  Abdominal: Soft. Bowel sounds are normal.  Musculoskeletal: Normal range of motion. He exhibits no edema and no tenderness.       Hands:      The left middle finger was cleaned with betadine then prepped and draped in sterile fashion, the finger was anesthetized with 2% plain lidocaine given as a digital block (.5 cc at each quadrant in the 3rd MCP joint), anesthesia was obtained an he has good capillary refill.  Using an 11 blade an incision was made over the radial side of the distal phalanx and a copious amount of pus was released, the wound was irrigated with H2O2 until no more pus was seen.  Post-procedure shows good capillary refill, hemostasis is obtained, and a dressing is applied. He tolerated it well.  Lymphadenopathy:    He has no cervical adenopathy.  Neurological: He is oriented to person, place, and time.  Skin: Skin is warm and dry. No rash noted. He is not  diaphoretic. No erythema. No pallor.  Psychiatric: He has a normal mood and affect. His behavior is normal. Judgment and thought content normal.          Assessment & Plan:

## 2011-01-05 NOTE — Patient Instructions (Signed)
Paronychia Paronychia is an inflammatory reaction involving the folds of the skin surrounding the fingernail. This is commonly caused by an infection in the skin around a nail. The most common cause of paronychia is frequent wetting of the hands (as seen with bartenders, food servers, nurses or others who wet their hands). This makes the skin around the fingernail susceptible to infection by bacteria (germs) or fungus. Other predisposing factors are:  Aggressive manicuring.   Nail biting.   Thumb sucking.  The most common cause is a staphylococcal (a type of germ) infection, or a fungal (Candida) infection. When caused by a germ, it usually comes on suddenly with redness, swelling, pus and is often painful. It may get under the nail and form an abscess (collection of pus), or form an abscess around the nail. If the nail itself is infected with a fungus, the treatment is usually prolonged and may require oral medicine for up to one year. Your caregiver will determine the length of time treatment is required. The paronychia caused by bacteria (germs) may largely be avoided by not pulling on hangnails or picking at cuticles. When the infection occurs at the tips of the finger it is called felon. When the cause of paronychia is from the herpes simplex virus (HSV) it is called herpetic whitlow. TREATMENT  When an abscess is present treatment is often incision and drainage. This means that the abscess must be cut open so the pus can get out. When this is done, the following home care instructions should be followed. HOME CARE INSTRUCTIONS   It is important to keep the affected fingers very dry. Rubber or plastic gloves over cotton gloves should be used whenever the hand must be placed in water.   Keep wound clean, dry and dressed as suggested by your caregiver between warm soaks or warm compresses.   Soak in warm water for fifteen to twenty minutes three to four times per day for bacterial infections.  Fungal infections are very difficult to treat, so often require treatment for long periods of time.   For bacterial (germ) infections take antibiotics (medicine which kill germs) as directed and finish the prescription, even if the problem appears to be solved before the medicine is gone.   Only take over-the-counter or prescription medicines for pain, discomfort, or fever as directed by your caregiver.  SEEK IMMEDIATE MEDICAL CARE IF:  You have redness, swelling, or increasing pain in the wound.   You notice pus coming from the wound.   You have a fever.   You notice a bad smell coming from the wound or dressing.  Document Released: 07/13/2000 Document Revised: 09/29/2010 Document Reviewed: 03/14/2008 Exit[employer] Patient Information 2012 [employer]. 

## 2011-01-05 NOTE — Assessment & Plan Note (Signed)
I and D was performed, culture was sent, he will start bactrim for the infection, he will RTC in 2 days for a recheck

## 2011-01-06 ENCOUNTER — Ambulatory Visit: Payer: BC Managed Care – PPO | Admitting: Internal Medicine

## 2011-01-07 ENCOUNTER — Encounter: Payer: Self-pay | Admitting: Internal Medicine

## 2011-01-07 ENCOUNTER — Ambulatory Visit (INDEPENDENT_AMBULATORY_CARE_PROVIDER_SITE_OTHER): Payer: BC Managed Care – PPO | Admitting: Internal Medicine

## 2011-01-07 DIAGNOSIS — IMO0001 Reserved for inherently not codable concepts without codable children: Secondary | ICD-10-CM

## 2011-01-07 DIAGNOSIS — L03019 Cellulitis of unspecified finger: Secondary | ICD-10-CM

## 2011-01-08 LAB — WOUND CULTURE

## 2011-01-09 ENCOUNTER — Encounter: Payer: Self-pay | Admitting: Internal Medicine

## 2011-01-09 NOTE — Progress Notes (Signed)
  Subjective:    Patient ID: Tyler Copeland, male    DOB: 08-08-1975, 35 y.o.   MRN: [id number]  HPI He returns for f/up regarding the left middle finger s/s I and D of a paronychia/felon, the area has markedly improved with no more pain or drainage.   Review of Systems  Constitutional: Negative for fever, chills and diaphoresis.  Respiratory: Negative for cough.   Cardiovascular: Positive for palpitations. Negative for chest pain and leg swelling.  Musculoskeletal: Negative for myalgias, back pain, joint swelling, arthralgias and gait problem.  Skin: Negative for color change, pallor, rash and wound.  Hematological: Negative for adenopathy. Does not bruise/bleed easily.       Objective:   Physical Exam  Vitals reviewed. Constitutional: He appears well-developed and well-nourished.  Eyes: Conjunctivae are normal.  Neck: Normal range of motion. Neck supple.  Cardiovascular: Normal rate and regular rhythm.   Pulmonary/Chest: Effort normal and breath sounds normal.  Musculoskeletal: Normal range of motion. He exhibits no edema and no tenderness.       Left middle finger shows that the incision sight has healed, there is no more exudate and there is no ttp, swelling, or warmth. There is good sensation and capillary refill and no FB.  Skin: Skin is warm and dry. No rash noted. No erythema. No pallor.          Assessment & Plan:

## 2011-01-09 NOTE — Assessment & Plan Note (Signed)
Marked improvement noted, awaiting final culture results, continue bactrim for now

## 2011-01-26 ENCOUNTER — Ambulatory Visit: Payer: BC Managed Care – PPO | Admitting: Internal Medicine

## 2011-01-27 ENCOUNTER — Ambulatory Visit: Payer: BC Managed Care – PPO | Admitting: Internal Medicine

## 2011-04-01 ENCOUNTER — Telehealth: Payer: Self-pay | Admitting: Internal Medicine

## 2011-04-01 NOTE — Telephone Encounter (Signed)
Received copies from [facility],on 04/01/11. Forwarded 3pages to Dr. Leretha Dykes review.

## 2011-05-06 ENCOUNTER — Ambulatory Visit: Payer: BC Managed Care – PPO | Admitting: Internal Medicine

## 2012-01-19 ENCOUNTER — Encounter: Payer: BC Managed Care – PPO | Admitting: Internal Medicine

## 2012-01-23 ENCOUNTER — Encounter: Payer: BC Managed Care – PPO | Admitting: Internal Medicine

## 2012-02-03 ENCOUNTER — Other Ambulatory Visit (INDEPENDENT_AMBULATORY_CARE_PROVIDER_SITE_OTHER): Payer: BC Managed Care – PPO

## 2012-02-03 ENCOUNTER — Ambulatory Visit (INDEPENDENT_AMBULATORY_CARE_PROVIDER_SITE_OTHER): Payer: BC Managed Care – PPO | Admitting: Internal Medicine

## 2012-02-03 ENCOUNTER — Encounter: Payer: Self-pay | Admitting: Internal Medicine

## 2012-02-03 VITALS — BP 130/80 | HR 81 | Temp 98.1°F | Resp 16 | Wt 334.0 lb

## 2012-02-03 DIAGNOSIS — J309 Allergic rhinitis, unspecified: Secondary | ICD-10-CM

## 2012-02-03 DIAGNOSIS — I10 Essential (primary) hypertension: Secondary | ICD-10-CM

## 2012-02-03 DIAGNOSIS — IMO0001 Reserved for inherently not codable concepts without codable children: Secondary | ICD-10-CM

## 2012-02-03 DIAGNOSIS — E7800 Pure hypercholesterolemia, unspecified: Secondary | ICD-10-CM

## 2012-02-03 LAB — CBC WITH DIFFERENTIAL/PLATELET
Basophils Relative: 0.6 % (ref 0.0–3.0)
Eosinophils Relative: 1 % (ref 0.0–5.0)
Monocytes Relative: 14.8 % — ABNORMAL HIGH (ref 3.0–12.0)
Neutrophils Relative %: 58.2 % (ref 43.0–77.0)
Platelets: 292 10*3/uL (ref 150.0–400.0)
RBC: 5.73 Mil/uL (ref 4.22–5.81)
WBC: 5.9 10*3/uL (ref 4.5–10.5)

## 2012-02-03 LAB — COMPREHENSIVE METABOLIC PANEL
Albumin: 3.8 g/dL (ref 3.5–5.2)
BUN: 16 mg/dL (ref 6–23)
CO2: 30 mEq/L (ref 19–32)
GFR: 110.51 mL/min (ref >60.00)
Glucose, Bld: 91 mg/dL (ref 70–99)
Potassium: 4.2 mEq/L (ref 3.5–5.1)
Sodium: 140 mEq/L (ref 135–145)
Total Bilirubin: 0.7 mg/dL (ref 0.3–1.2)
Total Protein: 8.5 g/dL — ABNORMAL HIGH (ref 6.0–8.3)

## 2012-02-03 LAB — URINALYSIS, ROUTINE W REFLEX MICROSCOPIC
Ketones, ur: NEGATIVE
Urine Glucose: NEGATIVE
Urobilinogen, UA: 0.2 (ref 0.0–1.0)

## 2012-02-03 LAB — LIPID PANEL
Cholesterol: 169 mg/dL (ref 0–200)
LDL Cholesterol: 106 mg/dL — ABNORMAL HIGH (ref 0–99)
Triglycerides: 72 mg/dL (ref 0.0–149.0)
VLDL: 14.4 mg/dL (ref 0.0–40.0)

## 2012-02-03 LAB — MICROALBUMIN / CREATININE URINE RATIO: Microalb Creat Ratio: 4.3 mg/g (ref 0.0–30.0)

## 2012-02-03 MED ORDER — EZETIMIBE-SIMVASTATIN 10-40 MG PO TABS: 1.0000 | ORAL_TABLET | Freq: Every day | ORAL | Status: DC

## 2012-02-03 MED ORDER — DESLORATADINE 5 MG PO TABS: 5.0000 mg | ORAL_TABLET | Freq: Every day | ORAL | Status: DC

## 2012-02-03 MED ORDER — OLMESARTAN MEDOXOMIL 20 MG PO TABS: 20.0000 mg | ORAL_TABLET | Freq: Every day | ORAL | Status: DC

## 2012-02-03 NOTE — Patient Instructions (Signed)

## 2012-02-03 NOTE — Progress Notes (Signed)
Subjective:    Patient ID: Tyler Copeland, male    DOB: [birth year], 37 y.o.   MRN: [id number]  Diabetes He presents for his follow-up diabetic visit. He has type 2 diabetes mellitus. His disease course has been stable. There are no hypoglycemic associated symptoms. Pertinent negatives for diabetes include no blurred vision, no chest pain, no fatigue, no foot paresthesias, no foot ulcerations, no polydipsia, no polyphagia, no polyuria, no visual change, no weakness and no weight loss. There are no hypoglycemic complications. Symptoms are stable. There are no diabetic complications. Current diabetic treatment includes diet. He is compliant with treatment some of the time. His weight is stable. He is following a generally healthy diet. Meal planning includes avoidance of concentrated sweets. He has not had a previous visit with a dietician. He participates in exercise intermittently. There is no change in his home blood glucose trend. An ACE inhibitor/angiotensin II receptor blocker is being taken. He does not see a podiatrist.Eye exam is current.      Review of Systems  Constitutional: Negative for fever, chills, weight loss, diaphoresis, activity change, appetite change, fatigue and unexpected weight change.  HENT: Negative.   Eyes: Negative.  Negative for blurred vision.  Respiratory: Negative for cough, chest tightness, shortness of breath, wheezing and stridor.   Cardiovascular: Negative for chest pain, palpitations and leg swelling.  Gastrointestinal: Negative.   Genitourinary: Negative.  Negative for polyuria.  Musculoskeletal: Negative for myalgias, back pain, joint swelling, arthralgias and gait problem.  Skin: Negative.   Neurological: Negative.  Negative for weakness.  Hematological: Negative.  Negative for polydipsia and polyphagia.  Psychiatric/Behavioral: Negative.        Objective:   Physical Exam  Vitals reviewed. Constitutional: He is oriented to person, place, and time. He  appears well-developed and well-nourished. No distress.  HENT:  Head: Normocephalic and atraumatic.  Mouth/Throat: No oropharyngeal exudate.  Eyes: Conjunctivae normal are normal. Right eye exhibits no discharge. Left eye exhibits no discharge. No scleral icterus.  Neck: Normal range of motion. Neck supple. No JVD present. No tracheal deviation present. No thyromegaly present.  Cardiovascular: Normal rate, regular rhythm, normal heart sounds and intact distal pulses.  Exam reveals no gallop and no friction rub.   No murmur heard. Pulmonary/Chest: Effort normal and breath sounds normal. No stridor. No respiratory distress. He has no wheezes. He has no rales. He exhibits no tenderness.  Abdominal: Soft. Bowel sounds are normal. He exhibits no distension and no mass. There is no tenderness. There is no rebound and no guarding.  Musculoskeletal: Normal range of motion. He exhibits no edema and no tenderness.  Lymphadenopathy:    He has no cervical adenopathy.  Neurological: He is oriented to person, place, and time.  Skin: Skin is warm and dry. No rash noted. He is not diaphoretic. No erythema. No pallor.  Psychiatric: He has a normal mood and affect. His behavior is normal. Judgment and thought content normal.     Lab Results  Component Value Date   WBC 5.1 11/05/2010   HGB 12.9* 11/05/2010   HCT 41.0 11/05/2010   PLT 286.0 11/05/2010   GLUCOSE 92 11/05/2010   CHOL 121 11/05/2010   TRIG 39.0 11/05/2010   HDL 40.90 11/05/2010   LDLDIRECT 163.8 07/20/2010   LDLCALC 72 11/05/2010   ALT 35 11/05/2010   AST 27 11/05/2010   NA 140 11/05/2010   K 3.9 11/05/2010   CL 106 11/05/2010   CREATININE 0.9 11/05/2010   BUN 16 11/05/2010  CO2 27 11/05/2010   TSH 1.46 07/20/2010   HGBA1C 6.8* 11/05/2010       Assessment & Plan:

## 2012-02-03 NOTE — Assessment & Plan Note (Signed)
He is doing well on vytorin 

## 2012-02-03 NOTE — Assessment & Plan Note (Signed)
His BP is well controlled I will check his lytes and renal function today 

## 2012-02-03 NOTE — Assessment & Plan Note (Signed)
I will check his a1c and will treat if needed 

## 2012-02-05 ENCOUNTER — Encounter: Payer: Self-pay | Admitting: Internal Medicine

## 2012-02-13 ENCOUNTER — Other Ambulatory Visit: Payer: Self-pay | Admitting: Internal Medicine

## 2012-02-13 DIAGNOSIS — I10 Essential (primary) hypertension: Secondary | ICD-10-CM

## 2012-02-13 MED ORDER — LOSARTAN POTASSIUM 100 MG PO TABS: 100.0000 mg | ORAL_TABLET | Freq: Every day | ORAL | Status: DC

## 2012-02-14 ENCOUNTER — Telehealth: Payer: Self-pay

## 2012-02-14 NOTE — Telephone Encounter (Signed)
Received fax from insurance stating Benicar is covered 02/01/12 until 03/02/13. Pharmacy notified

## 2012-03-30 ENCOUNTER — Telehealth: Payer: Self-pay | Admitting: Internal Medicine

## 2012-03-30 NOTE — Telephone Encounter (Signed)
Patients wife left message requesting to know which medications the patient should be taking so they can have them filled at the pharmacy

## 2012-03-30 NOTE — Telephone Encounter (Signed)
Returned call [facility] advising of three medication and recent fill date of 02/2012 for 90 day supply on all medications

## 2012-04-05 ENCOUNTER — Encounter: Payer: Self-pay | Admitting: Internal Medicine

## 2012-04-05 ENCOUNTER — Other Ambulatory Visit (INDEPENDENT_AMBULATORY_CARE_PROVIDER_SITE_OTHER): Payer: BC Managed Care – PPO

## 2012-04-05 ENCOUNTER — Ambulatory Visit (INDEPENDENT_AMBULATORY_CARE_PROVIDER_SITE_OTHER): Payer: BC Managed Care – PPO | Admitting: Internal Medicine

## 2012-04-05 VITALS — BP 118/70 | HR 90 | Temp 98.3°F | Resp 16 | Wt 341.0 lb

## 2012-04-05 DIAGNOSIS — R799 Abnormal finding of blood chemistry, unspecified: Secondary | ICD-10-CM

## 2012-04-05 DIAGNOSIS — R778 Other specified abnormalities of plasma proteins: Secondary | ICD-10-CM | POA: Insufficient documentation

## 2012-04-05 LAB — COMPREHENSIVE METABOLIC PANEL
ALT: 25 U/L (ref 0–53)
BUN: 13 mg/dL (ref 6–23)
CO2: 29 mEq/L (ref 19–32)
Calcium: 9.4 mg/dL (ref 8.4–10.5)
Chloride: 100 mEq/L (ref 96–112)
Creatinine, Ser: 1.1 mg/dL (ref 0.4–1.5)
GFR: 96.63 mL/min (ref >60.00)

## 2012-04-05 MED ORDER — METHYLPREDNISOLONE ACETATE 80 MG/ML IJ SUSP
80.0000 mg | Freq: Once | INTRAMUSCULAR | Status: AC
  Administered 2012-04-05: 80 mg via INTRAMUSCULAR

## 2012-04-05 MED ORDER — METHYLPREDNISOLONE ACETATE 40 MG/ML IJ SUSP
40.0000 mg | Freq: Once | INTRAMUSCULAR | Status: AC
  Administered 2012-04-05: 40 mg via INTRAMUSCULAR

## 2012-04-05 MED ORDER — FLUTICASONE PROPIONATE 50 MCG/ACT NA SUSP: 2.0000 | Freq: Every day | NASAL | Status: DC

## 2012-04-05 MED ORDER — CETIRIZINE-PSEUDOEPHEDRINE ER 5-120 MG PO TB12: 1.0000 | ORAL_TABLET | Freq: Two times a day (BID) | ORAL | Status: DC

## 2012-04-05 MED ORDER — MOMETASONE FUROATE 50 MCG/ACT NA SUSP: 2.0000 | Freq: Every day | NASAL | Status: DC

## 2012-04-05 NOTE — Assessment & Plan Note (Signed)
He is having a flare of symptoms so I gave him an injection of depo-medrol IM He will also start zyrtec-d and flonase ns

## 2012-04-05 NOTE — Patient Instructions (Signed)
Allergic Rhinitis  Allergic rhinitis is when the mucous membranes in the nose respond to allergens. Allergens are particles in the air that cause your body to have an allergic reaction. This causes you to release allergic antibodies. Through a chain of events, these eventually cause you to release histamine into the blood stream (hence the use of antihistamines). Although meant to be protective to the body, it is this release that causes your discomfort, such as frequent sneezing, congestion and an itchy runny nose.    CAUSES    The pollen allergens may come from grasses, trees, and weeds. This is seasonal allergic rhinitis, or "hay fever." Other allergens cause year-round allergic rhinitis (perennial allergic rhinitis) such as house dust mite allergen, pet dander and mold spores.    SYMPTOMS     Nasal stuffiness (congestion).   Runny, itchy nose with sneezing and tearing of the eyes.   There is often an itching of the mouth, eyes and ears.  It cannot be cured, but it can be controlled with medications.  DIAGNOSIS    If you are unable to determine the offending allergen, skin or blood testing may find it.  TREATMENT     Avoid the allergen.   Medications and allergy shots (immunotherapy) can help.   Hay fever may often be treated with antihistamines in pill or nasal spray forms. Antihistamines block the effects of histamine. There are over-the-counter medicines that may help with nasal congestion and swelling around the eyes. Check with your caregiver before taking or giving this medicine.  If the treatment above does not work, there are many new medications your caregiver can prescribe. Stronger medications may be used if initial measures are ineffective. Desensitizing injections can be used if medications and avoidance fails. Desensitization is when a patient is given ongoing shots until the body becomes less sensitive to the allergen. Make sure you follow up with your caregiver if problems continue.   SEEK MEDICAL CARE IF:     You develop fever (more than 100.5 F (38.1 C).   You develop a cough that does not stop easily (persistent).   You have shortness of breath.   You start wheezing.   Symptoms interfere with normal daily activities.  Document Released: 10/12/2000 Document Revised: 04/11/2011 Document Reviewed: 04/23/2008  [employer] Patient Information 2013 [employer].

## 2012-04-05 NOTE — Progress Notes (Signed)
Subjective:    Patient ID: Tyler Copeland, male    DOB: [birth year], 37 y.o.   MRN: [id number]  Sinusitis This is a new problem. The current episode started in the past 7 days. The problem has been rapidly worsening since onset. There has been no fever. The fever has been present for less than 1 day. His pain is at a severity of 0/10. He is experiencing no pain. Associated symptoms include congestion and sneezing. Pertinent negatives include no chills, coughing, diaphoresis, ear pain, headaches, hoarse voice, neck pain, shortness of breath, sinus pressure, sore throat or swollen glands. Past treatments include nothing.      Review of Systems  Constitutional: Negative.  Negative for fever, chills, diaphoresis, activity change, appetite change, fatigue and unexpected weight change.  HENT: Positive for congestion, rhinorrhea, sneezing and postnasal drip. Negative for ear pain, nosebleeds, sore throat, hoarse voice, facial swelling, trouble swallowing, neck pain, voice change and sinus pressure.   Eyes: Negative.   Respiratory: Negative.  Negative for cough and shortness of breath.   Cardiovascular: Negative for chest pain, palpitations and leg swelling.  Gastrointestinal: Negative.  Negative for nausea, vomiting, abdominal pain, diarrhea and constipation.  Endocrine: Negative.   Genitourinary: Negative.   Musculoskeletal: Negative.   Skin: Negative.   Allergic/Immunologic: Positive for environmental allergies. Negative for food allergies and immunocompromised state.  Neurological: Negative.  Negative for headaches.  Hematological: Negative for adenopathy. Does not bruise/bleed easily.  Psychiatric/Behavioral: Negative.        Objective:   Physical Exam  Vitals reviewed. Constitutional: He is oriented to person, place, and time. He appears well-developed and well-nourished.  Non-toxic appearance. He does not have a sickly appearance. He does not appear ill. No distress.  HENT:  Head:  Normocephalic. No trismus in the jaw.  Right Ear: Hearing, tympanic membrane, external ear and ear canal normal.  Left Ear: Hearing, tympanic membrane, external ear and ear canal normal.  Nose: Mucosal edema and rhinorrhea present. No nose lacerations, sinus tenderness, nasal deformity, septal deviation or nasal septal hematoma. No epistaxis.  No foreign bodies. Right sinus exhibits no maxillary sinus tenderness and no frontal sinus tenderness. Left sinus exhibits no maxillary sinus tenderness and no frontal sinus tenderness.  Mouth/Throat: Oropharynx is clear and moist and mucous membranes are normal. Mucous membranes are not pale, not dry and not cyanotic. No oral lesions. No edematous. No oropharyngeal exudate, posterior oropharyngeal edema, posterior oropharyngeal erythema or tonsillar abscesses.  Eyes: Conjunctivae are normal. Right eye exhibits no discharge. Left eye exhibits no discharge. No scleral icterus.  Neck: Normal range of motion. Neck supple. No JVD present. No tracheal deviation present. No thyromegaly present.  Cardiovascular: Normal rate, regular rhythm, normal heart sounds and intact distal pulses.  Exam reveals no gallop and no friction rub.   No murmur heard. Pulmonary/Chest: Effort normal and breath sounds normal. No stridor. No respiratory distress. He has no wheezes. He has no rales. He exhibits no tenderness.  Abdominal: Soft. He exhibits no distension and no mass. There is no tenderness. There is no rebound and no guarding.  Musculoskeletal: Normal range of motion. He exhibits no edema and no tenderness.  Lymphadenopathy:    He has no cervical adenopathy.  Neurological: He is oriented to person, place, and time.  Skin: Skin is warm and dry. No rash noted. He is not diaphoretic. No erythema. No pallor.  Psychiatric: He has a normal mood and affect. His behavior is normal. Judgment and thought content normal.  Lab Results  Component Value Date   WBC 5.9 02/03/2012    HGB 14.2 02/03/2012   HCT 44.2 02/03/2012   PLT 292.0 02/03/2012   GLUCOSE 91 02/03/2012   CHOL 169 02/03/2012   TRIG 72.0 02/03/2012   HDL 48.30 02/03/2012   LDLDIRECT 163.8 07/20/2010   LDLCALC 106* 02/03/2012   ALT 23 02/03/2012   AST 19 02/03/2012   NA 140 02/03/2012   K 4.2 02/03/2012   CL 103 02/03/2012   CREATININE 1.0 02/03/2012   BUN 16 02/03/2012   CO2 30 02/03/2012   TSH 1.44 02/03/2012   HGBA1C 5.9 02/03/2012   MICROALBUR 10.1* 02/03/2012       Assessment & Plan:

## 2012-04-05 NOTE — Assessment & Plan Note (Signed)
I will recheck his total protein level and his SPEP to see if he has lymphoproliferative disease

## 2012-04-10 LAB — PROTEIN ELECTROPHORESIS, SERUM
Albumin ELP: 52.1 % — ABNORMAL LOW (ref 55.8–66.1)
Alpha-1-Globulin: 4 % (ref 2.9–4.9)
Gamma Globulin: 22.1 % — ABNORMAL HIGH (ref 11.1–18.8)

## 2012-09-12 ENCOUNTER — Ambulatory Visit (INDEPENDENT_AMBULATORY_CARE_PROVIDER_SITE_OTHER): Payer: BC Managed Care – PPO | Admitting: Internal Medicine

## 2012-09-12 ENCOUNTER — Encounter: Payer: Self-pay | Admitting: Internal Medicine

## 2012-09-12 VITALS — BP 142/98 | HR 81 | Temp 98.0°F | Wt 342.0 lb

## 2012-09-12 DIAGNOSIS — I10 Essential (primary) hypertension: Secondary | ICD-10-CM

## 2012-09-12 DIAGNOSIS — E7800 Pure hypercholesterolemia, unspecified: Secondary | ICD-10-CM

## 2012-09-12 DIAGNOSIS — IMO0001 Reserved for inherently not codable concepts without codable children: Secondary | ICD-10-CM

## 2012-09-12 DIAGNOSIS — L03019 Cellulitis of unspecified finger: Secondary | ICD-10-CM

## 2012-09-12 MED ORDER — LOSARTAN POTASSIUM 100 MG PO TABS: 100.0000 mg | ORAL_TABLET | Freq: Every day | ORAL | Status: DC

## 2012-09-12 MED ORDER — SULFAMETHOXAZOLE-TMP DS 800-160 MG PO TABS: 1.0000 | ORAL_TABLET | Freq: Two times a day (BID) | ORAL | Status: DC

## 2012-09-12 MED ORDER — EZETIMIBE-SIMVASTATIN 10-40 MG PO TABS: 1.0000 | ORAL_TABLET | Freq: Every day | ORAL | Status: DC

## 2012-09-12 NOTE — Progress Notes (Signed)
Subjective:    Patient ID: Tyler Copeland, male    DOB: 01-Jun-1975, 37 y.o.   MRN: [id number]  HPI  Pt presents to the clinic today with c/o middle finger pain and swelling around his nail. He noticed this 3 days ago. He was biting his nails earlier this week. He has been putting peroxide and alcohol on which has seemed to help. It is not that tender to touch. He has had pain and swelling like this before and had to have it drained. He denies fever, chills or body aches. Additionally, he needs refills of losartan and Vytorin. His physical is scheduled for January. Labs reviewed. Review of Systems      Past Medical History  Diagnosis Date  . Plantar fasciitis   . Pemphigus   . Diabetes mellitus without complication   . Hyperlipidemia   . Hypertension     Current Outpatient Prescriptions  Medication Sig Dispense Refill  . cetirizine-pseudoephedrine (ZYRTEC-D) 5-120 MG per tablet Take 1 tablet by mouth 2 (two) times daily.  60 tablet  11  . ezetimibe-simvastatin (VYTORIN) 10-40 MG per tablet Take 1 tablet by mouth at bedtime.  90 tablet  3  . fluticasone (FLONASE) 50 MCG/ACT nasal spray Place 2 sprays into the nose daily.  16 g  11  . losartan (COZAAR) 100 MG tablet Take 1 tablet (100 mg total) by mouth daily.  90 tablet  3  . triamcinolone ointment (KENALOG) 0.1 %        No current facility-administered medications for this visit.    Allergies  Allergen Reactions  . Codeine     Codeine- Hallucinations  . Hydrocodone-Acetaminophen     Family History  Problem Relation Age of Onset  . Diabetes Other   . Hypertension Other   . Cancer Neg Hx   . Early death Neg Hx   . Heart disease Neg Hx   . Hyperlipidemia Neg Hx   . Kidney disease Neg Hx   . Stroke Neg Hx   . Alcohol abuse Neg Hx     History   Social History  . Marital Status: Married    Spouse Name: N/A    Number of Children: N/A  . Years of Education: N/A   Occupational History  . High School teacher and coach     Social History Main Topics  . Smoking status: Never Smoker   . Smokeless tobacco: Never Used  . Alcohol Use: No  . Drug Use: No  . Sexual Activity: Not Currently   Other Topics Concern  . Not on file   Social History Narrative   Regular exercise-No           Constitutional: Denies fever, malaise, fatigue, headache or abrupt weight changes.  Respiratory: Denies difficulty breathing, shortness of breath, cough or sputum production.   Cardiovascular: Denies chest pain, chest tightness, palpitations or swelling in the hands or feet.  Musculoskeletal: Denies decrease in range of motion, difficulty with gait, muscle pain or joint pain and swelling.  Skin: Pt reports redness and swelling of right middle finger. Denies rashes, lesions or ulcercations.    No other specific complaints in a complete review of systems (except as listed in HPI above).  Objective:   Physical Exam   BP 142/98  Pulse 81  Temp(Src) 98 F (36.7 C) (Oral)  Wt 342 lb (155.13 kg)  BMI 46.37 kg/m2  SpO2 97% Wt Readings from Last 3 Encounters:  09/12/12 342 lb (155.13 kg)  04/05/12 341 lb (  154.677 kg)  02/03/12 334 lb (151.501 kg)    General: Appears his stated age, obese but well developed, well nourished in NAD. Skin: Warm, dry and intact. Small non fluctuant paronychia of right middle finger. Cardiovascular: Normal rate and rhythm. S1,S2 noted.  No murmur, rubs or gallops noted. No JVD or BLE edema. No carotid bruits noted. Pulmonary/Chest: Normal effort and positive vesicular breath sounds. No respiratory distress. No wheezes, rales or ronchi noted.  Musculoskeletal: Normal range of motion. No signs of joint swelling. No difficulty with gait.    BMET    Component Value Date/Time   NA 137 04/05/2012 1510   K 4.1 04/05/2012 1510   CL 100 04/05/2012 1510   CO2 29 04/05/2012 1510   GLUCOSE 81 04/05/2012 1510   BUN 13 04/05/2012 1510   CREATININE 1.1 04/05/2012 1510   CALCIUM 9.4 04/05/2012 1510   GFRNONAA  124.12 12/11/2008 1619    Lipid Panel     Component Value Date/Time   CHOL 169 02/03/2012 1345   TRIG 72.0 02/03/2012 1345   HDL 48.30 02/03/2012 1345   CHOLHDL 3 02/03/2012 1345   VLDL 14.4 02/03/2012 1345   LDLCALC 106* 02/03/2012 1345    CBC    Component Value Date/Time   WBC 5.9 02/03/2012 1345   RBC 5.73 02/03/2012 1345   HGB 14.2 02/03/2012 1345   HCT 44.2 02/03/2012 1345   PLT 292.0 02/03/2012 1345   MCV 77.2* 02/03/2012 1345   MCHC 32.0 02/03/2012 1345   RDW 16.9* 02/03/2012 1345   LYMPHSABS 1.5 02/03/2012 1345   MONOABS 0.9 02/03/2012 1345   EOSABS 0.1 02/03/2012 1345   BASOSABS 0.0 02/03/2012 1345    Hgb A1C Lab Results  Component Value Date   HGBA1C 5.9 02/03/2012        Assessment & Plan:   Paronychia or right middle finger, too small to drain at this time:  eRx for septra Warm compresses Monitor for increased redness, swelling and pain  RTC as needed

## 2012-09-12 NOTE — Assessment & Plan Note (Signed)
Labs reviewed Vytorin refilled and samples given  Make a physical appointment for January

## 2012-09-12 NOTE — Assessment & Plan Note (Signed)
Elevated today but has not taken his medication Losartan refilled today  Make an physical appointment for January

## 2012-09-12 NOTE — Patient Instructions (Signed)
Paronychia Paronychia is an inflammatory reaction involving the folds of the skin surrounding the fingernail. This is commonly caused by an infection in the skin around a nail. The most common cause of paronychia is frequent wetting of the hands (as seen with bartenders, food servers, nurses or others who wet their hands). This makes the skin around the fingernail susceptible to infection by bacteria (germs) or fungus. Other predisposing factors are:  Aggressive manicuring.  Nail biting.  Thumb sucking. The most common cause is a staphylococcal (a type of germ) infection, or a fungal (Candida) infection. When caused by a germ, it usually comes on suddenly with redness, swelling, pus and is often painful. It may get under the nail and form an abscess (collection of pus), or form an abscess around the nail. If the nail itself is infected with a fungus, the treatment is usually prolonged and may require oral medicine for up to one year. Your caregiver will determine the length of time treatment is required. The paronychia caused by bacteria (germs) may largely be avoided by not pulling on hangnails or picking at cuticles. When the infection occurs at the tips of the finger it is called felon. When the cause of paronychia is from the herpes simplex virus (HSV) it is called herpetic whitlow. TREATMENT  When an abscess is present treatment is often incision and drainage. This means that the abscess must be cut open so the pus can get out. When this is done, the following home care instructions should be followed. HOME CARE INSTRUCTIONS   It is important to keep the affected fingers very dry. Rubber or plastic gloves over cotton gloves should be used whenever the hand must be placed in water.  Keep wound clean, dry and dressed as suggested by your caregiver between warm soaks or warm compresses.  Soak in warm water for fifteen to twenty minutes three to four times per day for bacterial infections. Fungal  infections are very difficult to treat, so often require treatment for long periods of time.  For bacterial (germ) infections take antibiotics (medicine which kill germs) as directed and finish the prescription, even if the problem appears to be solved before the medicine is gone.  Only take over-the-counter or prescription medicines for pain, discomfort, or fever as directed by your caregiver. SEEK IMMEDIATE MEDICAL CARE IF:  You have redness, swelling, or increasing pain in the wound.  You notice pus coming from the wound.  You have a fever.  You notice a bad smell coming from the wound or dressing. Document Released: 07/13/2000 Document Revised: 04/11/2011 Document Reviewed: 03/14/2008 [employer] Patient Information 2014 [employer].  

## 2012-12-01 ENCOUNTER — Encounter: Payer: Self-pay | Admitting: Internal Medicine

## 2012-12-01 ENCOUNTER — Ambulatory Visit (INDEPENDENT_AMBULATORY_CARE_PROVIDER_SITE_OTHER): Payer: BC Managed Care – PPO | Admitting: Internal Medicine

## 2012-12-01 VITALS — BP 140/100 | HR 73 | Temp 98.2°F | Ht 72.0 in | Wt 344.0 lb

## 2012-12-01 DIAGNOSIS — R0609 Other forms of dyspnea: Secondary | ICD-10-CM

## 2012-12-01 DIAGNOSIS — R0683 Snoring: Secondary | ICD-10-CM

## 2012-12-01 DIAGNOSIS — J329 Chronic sinusitis, unspecified: Secondary | ICD-10-CM

## 2012-12-01 DIAGNOSIS — J069 Acute upper respiratory infection, unspecified: Secondary | ICD-10-CM

## 2012-12-01 DIAGNOSIS — J309 Allergic rhinitis, unspecified: Secondary | ICD-10-CM

## 2012-12-01 MED ORDER — AMOXICILLIN-POT CLAVULANATE 875-125 MG PO TABS: 1.0000 | ORAL_TABLET | Freq: Two times a day (BID) | ORAL | Status: DC

## 2012-12-01 MED ORDER — MOMETASONE FUROATE 50 MCG/ACT NA SUSP: NASAL | Status: DC

## 2012-12-01 NOTE — Patient Instructions (Addendum)
Antibiotic for possible sinusitis  infection. Advise nasal cortisone  That you can tolerated to help with snoring and allergies and sinsutis infections.   Shouldn't drips down your throat  Will rx another  Spray to see if  Better. There are more options   Also   Advise you see your PCP and discuss evaluation for possible sleep apnea.  Will rx another  Spray to see if  Better. There are more options   Also    Sleep Apnea  Sleep apnea is a sleep disorder characterized by abnormal pauses in breathing while you sleep. When your breathing pauses, the level of oxygen in your blood decreases. This causes you to move out of deep sleep and into light sleep. As a result, your quality of sleep is poor, and the system that carries your blood throughout your body (cardiovascular system) experiences stress. If sleep apnea remains untreated, the following conditions can develop:  High blood pressure (hypertension).  Coronary artery disease.  Inability to achieve or maintain an erection (impotence).  Impairment of your thought process (cognitive dysfunction). There are three types of sleep apnea: 1. Obstructive sleep apnea Pauses in breathing during sleep because of a blocked airway. 2. Central sleep apnea Pauses in breathing during sleep because the area of the brain that controls your breathing does not send the correct signals to the muscles that control breathing. 3. Mixed sleep apnea A combination of both obstructive and central sleep apnea. RISK FACTORS The following risk factors can increase your risk of developing sleep apnea:  Being overweight.  Smoking.  Having narrow passages in your nose and throat.  Being of older age.  Being male.  Alcohol use.  Sedative and tranquilizer use.  Ethnicity. Among individuals younger than 35 years, African Americans are at increased risk of sleep apnea. SYMPTOMS   Difficulty staying asleep.  Daytime sleepiness and fatigue.  Loss of  energy.  Irritability.  Loud, heavy snoring.  Morning headaches.  Trouble concentrating.  Forgetfulness.  Decreased interest in sex. DIAGNOSIS  In order to diagnose sleep apnea, your caregiver will perform a physical examination. Your caregiver may suggest that you take a home sleep test. Your caregiver may also recommend that you spend the night in a sleep lab. In the sleep lab, several monitors record information about your heart, lungs, and brain while you sleep. Your leg and arm movements and blood oxygen level are also recorded. TREATMENT The following actions may help to resolve mild sleep apnea:  Sleeping on your side.   Using a decongestant if you have nasal congestion.   Avoiding the use of depressants, including alcohol, sedatives, and narcotics.   Losing weight and modifying your diet if you are overweight. There also are devices and treatments to help open your airway:  Oral appliances. These are custom-made mouthpieces that shift your lower jaw forward and slightly open your bite. This opens your airway.  Devices that create positive airway pressure. This positive pressure "splints" your airway open to help you breathe better during sleep. The following devices create positive airway pressure:  Continuous positive airway pressure (CPAP) device. The CPAP device creates a continuous level of air pressure with an air pump. The air is delivered to your airway through a mask while you sleep. This continuous pressure keeps your airway open.  Nasal expiratory positive airway pressure (EPAP) device. The EPAP device creates positive air pressure as you exhale. The device consists of single-use valves, which are inserted into each nostril and held in  place by adhesive. The valves create very little resistance when you inhale but create much more resistance when you exhale. That increased resistance creates the positive airway pressure. This positive pressure while you exhale  keeps your airway open, making it easier to breath when you inhale again.  Bilevel positive airway pressure (BPAP) device. The BPAP device is used mainly in patients with central sleep apnea. This device is similar to the CPAP device because it also uses an air pump to deliver continuous air pressure through a mask. However, with the BPAP machine, the pressure is set at two different levels. The pressure when you exhale is lower than the pressure when you inhale.  Surgery. Typically, surgery is only done if you cannot comply with less invasive treatments or if the less invasive treatments do not improve your condition. Surgery involves removing excess tissue in your airway to create a wider passage way. Document Released: 01/07/2002 Document Revised: 07/19/2011 Document Reviewed: 05/26/2011 [facility] Patient Information 2014 [place], [place].

## 2012-12-01 NOTE — Progress Notes (Signed)
Chief Complaint  Patient presents with  . URI    congestion, sinus pressure, cough w/ phlegm (white)    HPI: Patient comes in today for [facility] Saturday clinic for  new problem evaluation. Came in with wife and asked to be seen . She has had a prolonged resp infection  Over 10 days   Onset about 1 week a go.  " Cough and sinusitis"  suffy nose down to throat .  Sneezing .  facial pressure some cough but no sob  Gets shot of steroid for allergies  He calls this allergy shot   For   sinuses. last time spring.  Doesn't like to use flonase drips down throat.  No tobacco and smoking  Works Psychologist, occupational HS  sinsu pressure  Is persistent   Robitussin at night . And aleve.  And theraflu.    ROS: See pertinent positives and negatives per HPI. Wife says snores and thinks could have OSA  But not disc with doctors .  Has dm   Past Medical History  Diagnosis Date  . Plantar fasciitis   . Pemphigus   . Diabetes mellitus without complication   . Hyperlipidemia   . Hypertension     Family History  Problem Relation Age of Onset  . Diabetes Other   . Hypertension Other   . Cancer Neg Hx   . Early death Neg Hx   . Heart disease Neg Hx   . Hyperlipidemia Neg Hx   . Kidney disease Neg Hx   . Stroke Neg Hx   . Alcohol abuse Neg Hx     History   Social History  . Marital Status: Married    Spouse Name: N/A    Number of Children: N/A  . 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 Smoking status: Never Smoker   . Smokeless tobacco: Never Used  . Alcohol Use: No  . Drug Use: No  . Sexual Activity: Not Currently   Other Topics Concern  . Not on file   Social History Narrative   Regular exercise-No          Outpatient Encounter Prescriptions as of 12/01/2012  Medication Sig  . cetirizine-pseudoephedrine (ZYRTEC-D) 5-120 MG per tablet Take 1 tablet by mouth 2 (two) times daily.  Marland Kitchen ezetimibe-simvastatin (VYTORIN)  10-40 MG per tablet Take 1 tablet by mouth at bedtime.  . fluticasone (FLONASE) 50 MCG/ACT nasal spray Place 2 sprays into the nose daily.  Marland Kitchen losartan (COZAAR) 100 MG tablet Take 1 tablet (100 mg total) by mouth daily.  Marland Kitchen triamcinolone ointment (KENALOG) 0.1 %   . amoxicillin-clavulanate (AUGMENTIN) 875-125 MG per tablet Take 1 tablet by mouth every 12 (twelve) hours.  . mometasone (NASONEX) 50 MCG/ACT nasal spray 2 sprays each nostril qd  . [DISCONTINUED] sulfamethoxazole-trimethoprim (BACTRIM DS) 800-160 MG per tablet Take 1 tablet by mouth 2 (two) times daily.    EXAM:  BP 140/100  Pulse 73  Temp(Src) 98.2 F (36.8 C) (Oral)  Ht 6' (1.829 m)  Wt 344 lb 0.6 oz (156.056 kg)  BMI 46.65 kg/m2  SpO2 97%  Body mass index is 46.65 kg/(m^2).  GENERAL: vitals reviewed and listed above, alert, oriented, appears well hydrated and in no acute distress congested here with wife  HEENT: Normocephalic ;atraumatic , Eyes;  PERRL, EOMs  Full, lids and conjunctiva clear,,Ears: no deformities, canals nl, TM landmarks normal, Nose: no deformity  or  Clear discharge stuffy face tender maxiila nd frontal mild   Mouth : OP clear without lesion or edemaa .Low soft palate NECK: no obvious masses on inspection palpation  LUNGS: clear to auscultation bilaterally, no wheezes, rales or rhonchi, good air movement CV: HRRR, no clubbing cyanosis or   nl cap refill  PSYCH: pleasant and cooperative, no obvious depression or anxiety  ASSESSMENT AND PLAN:  Discussed the following assessment and plan:  Sinusitis - over 1 week  Allergic rhinitis, cause unspecified  URI, acute  Snoring - poss osa  Disc other nasal steroid may benefit from different delivery spray -Patient advised to return or notify health care team  if symptoms worsen or persist or new concerns arise.  Patient Instructions  Antibiotic for possible sinusitis  infection. Advise nasal cortisone  That you can tolerated to help with snoring and  allergies and sinsutis infections.   Shouldn't drips down your throat  Will rx another  Spray to see if  Better. There are more options   Also   Advise you see your PCP and discuss evaluation for possible sleep apnea.  Will rx another  Spray to see if  Better. There are more options   Also    Sleep Apnea  Sleep apnea is a sleep disorder characterized by abnormal pauses in breathing while you sleep. When your breathing pauses, the level of oxygen in your blood decreases. This causes you to move out of deep sleep and into light sleep. As a result, your quality of sleep is poor, and the system that carries your blood throughout your body (cardiovascular system) experiences stress. If sleep apnea remains untreated, the following conditions can develop:  High blood pressure (hypertension).  Coronary artery disease.  Inability to achieve or maintain an erection (impotence).  Impairment of your thought process (cognitive dysfunction). There are three types of sleep apnea: 1. Obstructive sleep apnea Pauses in breathing during sleep because of a blocked airway. 2. Central sleep apnea Pauses in breathing during sleep because the area of the brain that controls your breathing does not send the correct signals to the muscles that control breathing. 3. Mixed sleep apnea A combination of both obstructive and central sleep apnea. RISK FACTORS The following risk factors can increase your risk of developing sleep apnea:  Being overweight.  Smoking.  Having narrow passages in your nose and throat.  Being of older age.  Being male.  Alcohol use.  Sedative and tranquilizer use.  Ethnicity. Among individuals younger than 35 years, African Americans are at increased risk of sleep apnea. SYMPTOMS   Difficulty staying asleep.  Daytime sleepiness and fatigue.  Loss of energy.  Irritability.  Loud, heavy snoring.  Morning headaches.  Trouble concentrating.  Forgetfulness.  Decreased  interest in sex. DIAGNOSIS  In order to diagnose sleep apnea, your caregiver will perform a physical examination. Your caregiver may suggest that you take a home sleep test. Your caregiver may also recommend that you spend the night in a sleep lab. In the sleep lab, several monitors record information about your heart, lungs, and brain while you sleep. Your leg and arm movements and blood oxygen level are also recorded. TREATMENT The following actions may help to resolve mild sleep apnea:  Sleeping on your side.   Using a decongestant if you have nasal congestion.   Avoiding the use of depressants, including alcohol, sedatives, and narcotics.   Losing weight and modifying your diet if you are overweight. There also are devices  and treatments to help open your airway:  Oral appliances. These are custom-made mouthpieces that shift your lower jaw forward and slightly open your bite. This opens your airway.  Devices that create positive airway pressure. This positive pressure "splints" your airway open to help you breathe better during sleep. The following devices create positive airway pressure:  Continuous positive airway pressure (CPAP) device. The CPAP device creates a continuous level of air pressure with an air pump. The air is delivered to your airway through a mask while you sleep. This continuous pressure keeps your airway open.  Nasal expiratory positive airway pressure (EPAP) device. The EPAP device creates positive air pressure as you exhale. The device consists of single-use valves, which are inserted into each nostril and held in place by adhesive. The valves create very little resistance when you inhale but create much more resistance when you exhale. That increased resistance creates the positive airway pressure. This positive pressure while you exhale keeps your airway open, making it easier to breath when you inhale again.  Bilevel positive airway pressure (BPAP) device. The  BPAP device is used mainly in patients with central sleep apnea. This device is similar to the CPAP device because it also uses an air pump to deliver continuous air pressure through a mask. However, with the BPAP machine, the pressure is set at two different levels. The pressure when you exhale is lower than the pressure when you inhale.  Surgery. Typically, surgery is only done if you cannot comply with less invasive treatments or if the less invasive treatments do not improve your condition. Surgery involves removing excess tissue in your airway to create a wider passage way. Document Released: 01/07/2002 Document Revised: 07/19/2011 Document Reviewed: 05/26/2011 [facility] Patient Information 2014 [place], [place].      Neta Mends. Gus Littler M.D.

## 2013-01-31 HISTORY — PX: MOUTH SURGERY: SHX715

## 2013-02-11 ENCOUNTER — Encounter: Payer: BC Managed Care – PPO | Admitting: Internal Medicine

## 2013-02-11 DIAGNOSIS — Z0289 Encounter for other administrative examinations: Secondary | ICD-10-CM

## 2013-03-30 ENCOUNTER — Encounter: Payer: Self-pay | Admitting: Family Medicine

## 2013-03-30 ENCOUNTER — Ambulatory Visit (INDEPENDENT_AMBULATORY_CARE_PROVIDER_SITE_OTHER): Payer: BC Managed Care – PPO | Admitting: Family Medicine

## 2013-03-30 VITALS — BP 122/82 | HR 90 | Temp 97.8°F | Ht 74.0 in | Wt 343.0 lb

## 2013-03-30 DIAGNOSIS — B9689 Other specified bacterial agents as the cause of diseases classified elsewhere: Secondary | ICD-10-CM

## 2013-03-30 DIAGNOSIS — J019 Acute sinusitis, unspecified: Secondary | ICD-10-CM

## 2013-03-30 MED ORDER — BENZONATATE 200 MG PO CAPS: 200.0000 mg | ORAL_CAPSULE | Freq: Three times a day (TID) | ORAL | Status: DC | PRN

## 2013-03-30 MED ORDER — AMOXICILLIN-POT CLAVULANATE 875-125 MG PO TABS: 1.0000 | ORAL_TABLET | Freq: Two times a day (BID) | ORAL | Status: DC

## 2013-03-30 NOTE — Assessment & Plan Note (Signed)
Will cover with augmentin Fluids/ rest and tessalon px for cough  Can also use delsym prn Disc symptomatic care - see instructions on AVS  Update if not starting to improve in a week or if worsening

## 2013-03-30 NOTE — Patient Instructions (Signed)
Take augmentin for sinus infection (you can stop the penicillin) Drink lots of fluids  Try the tessalon for cough - it is a pill/ swallow it whole  Update if not starting to improve in a week or if worsening

## 2013-03-30 NOTE — Progress Notes (Signed)
Subjective:    Patient ID: Tyler Copeland, male    DOB: Jan 01, 1976, 38 y.o.   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 Elevated total protein 04/05/2012  . Allergic rhinitis, cause unspecified 11/05/2010  . Type II or unspecified type diabetes mellitus without mention of complication, uncontrolled 08/25/2010  . Pure hypercholesterolemia 08/25/2010  . Essential hypertension, benign 08/25/2010  . Morbid obesity 12/11/2008   Past Medical History  Diagnosis Date  . Plantar fasciitis   . Pemphigus   . Diabetes mellitus without complication   . Hyperlipidemia   . Hypertension    No past surgical history on file. History  Substance Use Topics  . Smoking status: Never Smoker   . Smokeless tobacco: Never Used  . Alcohol Use: No   Family History  Problem Relation Age of Onset  . Diabetes Other   . Hypertension Other   . Cancer Neg Hx   . Early death Neg Hx   . Heart disease Neg Hx   . Hyperlipidemia Neg Hx   . Kidney disease Neg Hx   . Stroke Neg Hx   . Alcohol abuse Neg Hx    Allergies  Allergen Reactions  . Codeine     Codeine- Hallucinations  . Hydrocodone-Acetaminophen    Current Outpatient Prescriptions on File Prior to Visit  Medication Sig Dispense Refill  . amoxicillin-clavulanate (AUGMENTIN) 875-125 MG per tablet Take 1 tablet by mouth every 12 (twelve) hours.  20 tablet  0  . cetirizine-pseudoephedrine (ZYRTEC-D) 5-120 MG per tablet Take 1 tablet by mouth 2 (two) times  daily.  60 tablet  11  . ezetimibe-simvastatin (VYTORIN) 10-40 MG per tablet Take 1 tablet by mouth at bedtime.  90 tablet  3  . fluticasone (FLONASE) 50 MCG/ACT nasal spray Place 2 sprays into the nose daily.  16 g  11  . losartan (COZAAR) 100 MG tablet Take 1 tablet (100 mg total) by mouth daily.  90 tablet  3  . mometasone (NASONEX) 50 MCG/ACT nasal spray 2 sprays each nostril qd  17 g  2  . triamcinolone ointment (KENALOG) 0.1 %        No current facility-administered medications on file prior to visit.     Review of Systems Review of Systems  Constitutional: Negative for , appetite change,  and unexpected weight change.  Eyes: Negative for pain and visual disturbance.  ENt pos for congestion and rhinorrhea and facial pain/ pressure  Respiratory: Negative for wheeze  and shortness of breath.   Cardiovascular: Negative for cp or palpitations    Gastrointestinal: Negative for nausea, diarrhea and constipation.  Genitourinary: Negative for urgency and frequency.  Skin: Negative for pallor or rash   Neurological: Negative for weakness, light-headedness, numbness and headaches.  Hematological: Negative for adenopathy. Does not bruise/bleed easily.  Psychiatric/Behavioral: Negative for dysphoric mood. The patient is not nervous/anxious.  Objective:   Physical Exam  Constitutional: He appears well-developed and well-nourished. No distress.  obese and well appearing   HENT:  Head: Normocephalic and atraumatic.  Right Ear: External ear normal.  Left Ear: External ear normal.  Mouth/Throat: Oropharynx is clear and moist. No oropharyngeal exudate.  Nares are injected and congested  Bilateral maxillary sinus tenderness  Post nasal drip  TMs are dull  Eyes: Conjunctivae and EOM are normal. Pupils are equal, round, and reactive to light. Right eye exhibits no discharge. Left eye exhibits no discharge.  Neck: Normal range of motion. Neck supple.  Cardiovascular: Normal rate and  regular rhythm.   Pulmonary/Chest: Effort normal and breath sounds normal. No respiratory distress. He has no wheezes. He has no rales.  Lymphadenopathy:    He has no cervical adenopathy.  Neurological: He is alert.  Skin: Skin is warm and dry. No rash noted.  Psychiatric: He has a normal mood and affect.          Assessment & Plan:

## 2013-04-10 ENCOUNTER — Encounter: Payer: Self-pay | Admitting: Internal Medicine

## 2013-04-10 ENCOUNTER — Other Ambulatory Visit (INDEPENDENT_AMBULATORY_CARE_PROVIDER_SITE_OTHER): Payer: BC Managed Care – PPO

## 2013-04-10 ENCOUNTER — Ambulatory Visit (INDEPENDENT_AMBULATORY_CARE_PROVIDER_SITE_OTHER): Payer: BC Managed Care – PPO | Admitting: Internal Medicine

## 2013-04-10 VITALS — BP 116/80 | HR 82 | Temp 98.1°F | Resp 16 | Wt 342.8 lb

## 2013-04-10 DIAGNOSIS — R7309 Other abnormal glucose: Secondary | ICD-10-CM

## 2013-04-10 DIAGNOSIS — R7303 Prediabetes: Secondary | ICD-10-CM

## 2013-04-10 DIAGNOSIS — J310 Chronic rhinitis: Secondary | ICD-10-CM

## 2013-04-10 LAB — HEMOGLOBIN A1C: Hgb A1c MFr Bld: 6.3 % (ref 4.6–6.5)

## 2013-04-10 NOTE — Patient Instructions (Signed)

## 2013-04-10 NOTE — Progress Notes (Signed)
   Subjective:    Patient ID: Tyler Copeland, male    DOB: Nov 02, 1975, 38 y.o.   MRN: [id number]  HPI  At this time he continues to have rhinitis and head congestion. He also has pain in the maxillary sinus area. He describes some earache. Generic Augmentin was Rxed 2/28 for acute bacterial sinusitis. He completed the last dose 3/9. Cough is productive and clear of sputum.    Review of Systems  The purulent secretions from his chest have resolved. He also denies frontal headache, extrinsic rhinoconjunctivitis symptoms, fever, or chills. He has some sweating.  He has no nasal purulence.  The cough is not associated with wheezing or shortness of breath.  No FBS monitor     Objective:   Physical Exam General appearance:adequately  nourished; morbidly obese.No acute distress or increased work of breathing is present.  No  lymphadenopathy about the head, neck, or axilla noted.   Eyes: No conjunctival inflammation or lid edema is present. There is no scleral icterus.  Ears:  External ear exam shows no significant lesions or deformities.  Otoscopic examination reveals clear canals, tympanic membranes are intact bilaterally without bulging, retraction, inflammation or discharge.  Nose:  External nasal examination shows no deformity or inflammation. Nasal mucosa are pink and moist without lesions or exudates. No septal dislocation or deviation.No obstruction to airflow.   Oral exam: Dental hygiene is good; lips and gums are healthy appearing.There is no oropharyngeal erythema or exudate noted.   Neck:  No deformities, masses, or tenderness noted.   Supple with full range of motion without pain.   Heart:  Normal rate and regular rhythm. S1 and S2 normal without gallop, murmur, click, rub or other extra sounds.   Lungs:Chest clear to auscultation; no wheezes, rhonchi,rales ,or rubs present.No increased work of breathing.    Extremities:  No cyanosis, edema, or clubbing  noted    Skin:  Warm & dry w/o jaundice or tenting.         Assessment & Plan:  #1 rhinitis See recommendation

## 2013-04-10 NOTE — Progress Notes (Signed)
Pre visit review using our clinic review tool, if applicable. No additional management support is needed unless otherwise documented below in the visit note. 

## 2013-04-15 ENCOUNTER — Encounter: Payer: Self-pay | Admitting: *Deleted

## 2013-05-08 ENCOUNTER — Encounter: Payer: BC Managed Care – PPO | Admitting: Internal Medicine

## 2013-05-08 DIAGNOSIS — Z0289 Encounter for other administrative examinations: Secondary | ICD-10-CM

## 2013-06-14 ENCOUNTER — Encounter: Payer: BC Managed Care – PPO | Admitting: Internal Medicine

## 2013-07-12 ENCOUNTER — Encounter: Payer: Self-pay | Admitting: Internal Medicine

## 2013-07-12 ENCOUNTER — Ambulatory Visit (INDEPENDENT_AMBULATORY_CARE_PROVIDER_SITE_OTHER): Payer: BC Managed Care – PPO | Admitting: Internal Medicine

## 2013-07-12 VITALS — BP 120/80 | HR 80 | Temp 98.1°F | Resp 16 | Ht 72.0 in | Wt 345.8 lb

## 2013-07-12 DIAGNOSIS — J309 Allergic rhinitis, unspecified: Secondary | ICD-10-CM

## 2013-07-12 DIAGNOSIS — R799 Abnormal finding of blood chemistry, unspecified: Secondary | ICD-10-CM

## 2013-07-12 DIAGNOSIS — R778 Other specified abnormalities of plasma proteins: Secondary | ICD-10-CM

## 2013-07-12 MED ORDER — METHYLPREDNISOLONE ACETATE 80 MG/ML IJ SUSP
80.0000 mg | Freq: Once | INTRAMUSCULAR | Status: AC
  Administered 2013-07-12: 120 mg via INTRAMUSCULAR

## 2013-07-12 MED ORDER — AZELASTINE-FLUTICASONE 137-50 MCG/ACT NA SUSP: 1.0000 | Freq: Two times a day (BID) | NASAL | Status: DC

## 2013-07-12 NOTE — Patient Instructions (Signed)
Allergic Rhinitis Allergic rhinitis is when the mucous membranes in the nose respond to allergens. Allergens are particles in the air that cause your body to have an allergic reaction. This causes you to release allergic antibodies. Through a chain of events, these eventually cause you to release histamine into the blood stream. Although meant to protect the body, it is this release of histamine that causes your discomfort, such as frequent sneezing, congestion, and an itchy, runny nose.  CAUSES  Seasonal allergic rhinitis (hay fever) is caused by pollen allergens that may come from grasses, trees, and weeds. Year-round allergic rhinitis (perennial allergic rhinitis) is caused by allergens such as house dust mites, pet dander, and mold spores.  SYMPTOMS   Nasal stuffiness (congestion).  Itchy, runny nose with sneezing and tearing of the eyes. DIAGNOSIS  Your health care provider can help you determine the allergen or allergens that trigger your symptoms. If you and your health care provider are unable to determine the allergen, skin or blood testing may be used. TREATMENT  Allergic Rhinitis does not have a cure, but it can be controlled by:  Medicines and allergy shots (immunotherapy).  Avoiding the allergen. Hay fever may often be treated with antihistamines in pill or nasal spray forms. Antihistamines block the effects of histamine. There are over-the-counter medicines that may help with nasal congestion and swelling around the eyes. Check with your health care provider before taking or giving this medicine.  If avoiding the allergen or the medicine prescribed do not work, there are many new medicines your health care provider can prescribe. Stronger medicine may be used if initial measures are ineffective. Desensitizing injections can be used if medicine and avoidance does not work. Desensitization is when a patient is given ongoing shots until the body becomes less sensitive to the allergen.  Make sure you follow up with your health care provider if problems continue. HOME CARE INSTRUCTIONS It is not possible to completely avoid allergens, but you can reduce your symptoms by taking steps to limit your exposure to them. It helps to know exactly what you are allergic to so that you can avoid your specific triggers. SEEK MEDICAL CARE IF:   You have a fever.  You develop a cough that does not stop easily (persistent).  You have shortness of breath.  You start wheezing.  Symptoms interfere with normal daily activities. Document Released: 10/12/2000 Document Revised: 11/07/2012 Document Reviewed: 09/24/2012 [employer] Patient Information 2014 [employer].  

## 2013-07-12 NOTE — Progress Notes (Signed)
Pre visit review using our clinic review tool, if applicable. No additional management support is needed unless otherwise documented below in the visit note. 

## 2013-07-13 ENCOUNTER — Encounter: Payer: Self-pay | Admitting: Internal Medicine

## 2013-07-13 MED ORDER — CETIRIZINE HCL 10 MG PO TABS
10.0000 mg | ORAL_TABLET | Freq: Every day | ORAL | Status: DC
Start: 2013-07-13 — End: 2014-02-07

## 2013-07-13 NOTE — Progress Notes (Signed)
   Subjective:    Patient ID: Tyler Copeland, male    DOB: 01/15/1976, 38 y.o.   MRN: [id number]  HPI Comments: He returns c/o nasal congestion and runny nose.     Review of Systems  Constitutional: Negative.  Negative for fever, chills, diaphoresis, appetite change and fatigue.  HENT: Positive for congestion, postnasal drip, rhinorrhea and sneezing. Negative for ear discharge, facial swelling, hearing loss, nosebleeds, sinus pressure, sore throat, tinnitus, trouble swallowing and voice change.   Eyes: Negative.   Respiratory: Negative.   Cardiovascular: Negative.  Negative for chest pain, palpitations and leg swelling.  Gastrointestinal: Negative.  Negative for nausea, vomiting, abdominal pain, diarrhea and constipation.  Endocrine: Negative.   Genitourinary: Negative.   Musculoskeletal: Negative.  Negative for arthralgias, back pain, myalgias and neck pain.  Skin: Negative.  Negative for rash.  Allergic/Immunologic: Negative.   Neurological: Negative.   Hematological: Negative.  Negative for adenopathy. Does not bruise/bleed easily.  Psychiatric/Behavioral: Negative.        Objective:   Physical Exam  Vitals reviewed. Constitutional: He is oriented to person, place, and time. He appears well-developed and well-nourished. No distress.  HENT:  Head: Normocephalic and atraumatic.  Right Ear: Hearing, tympanic membrane, external ear and ear canal normal.  Left Ear: Hearing, tympanic membrane, external ear and ear canal normal.  Nose: Mucosal edema and rhinorrhea present. No sinus tenderness, nasal deformity, septal deviation or nasal septal hematoma. No epistaxis.  No foreign bodies. Right sinus exhibits no maxillary sinus tenderness and no frontal sinus tenderness. Left sinus exhibits no maxillary sinus tenderness and no frontal sinus tenderness.  Mouth/Throat: Oropharynx is clear and moist and mucous membranes are normal. Mucous membranes are not pale, not dry and not cyanotic. No  oral lesions. No trismus in the jaw. No uvula swelling. No oropharyngeal exudate, posterior oropharyngeal edema, posterior oropharyngeal erythema or tonsillar abscesses.  Eyes: Conjunctivae are normal. Right eye exhibits no discharge. Left eye exhibits no discharge. No scleral icterus.  Neck: Normal range of motion. Neck supple. No JVD present. No tracheal deviation present. No thyromegaly present.  Cardiovascular: Normal rate, regular rhythm, normal heart sounds and intact distal pulses.  Exam reveals no gallop and no friction rub.   No murmur heard. Pulmonary/Chest: Effort normal and breath sounds normal. No stridor. No respiratory distress. He has no wheezes. He has no rales. He exhibits no tenderness.  Abdominal: Soft. Bowel sounds are normal. He exhibits no distension and no mass. There is no tenderness. There is no rebound and no guarding.  Musculoskeletal: Normal range of motion. He exhibits no edema and no tenderness.  Lymphadenopathy:    He has no cervical adenopathy.  Neurological: He is oriented to person, place, and time.  Skin: Skin is warm and dry. No rash noted. He is not diaphoretic. No erythema. No pallor.  Psychiatric: He has a normal mood and affect. His behavior is normal. Judgment and thought content normal.          Assessment & Plan:

## 2013-07-13 NOTE — Assessment & Plan Note (Addendum)
He is having a flare up of his s/s so I gave him an injection of depo-medrol IM I also think dymista will be a better NS for him I have asked him to start zyrtec as well

## 2013-09-17 ENCOUNTER — Other Ambulatory Visit: Payer: Self-pay | Admitting: Internal Medicine

## 2013-09-24 ENCOUNTER — Other Ambulatory Visit: Payer: Self-pay | Admitting: Internal Medicine

## 2013-09-26 ENCOUNTER — Other Ambulatory Visit: Payer: Self-pay

## 2013-09-26 DIAGNOSIS — I10 Essential (primary) hypertension: Secondary | ICD-10-CM

## 2013-09-26 MED ORDER — LOSARTAN POTASSIUM 100 MG PO TABS: 100.0000 mg | ORAL_TABLET | Freq: Every day | ORAL | Status: DC

## 2014-01-18 ENCOUNTER — Ambulatory Visit: Payer: BC Managed Care – PPO | Admitting: Family Medicine

## 2014-02-07 ENCOUNTER — Encounter: Payer: Self-pay | Admitting: Internal Medicine

## 2014-02-07 ENCOUNTER — Ambulatory Visit (INDEPENDENT_AMBULATORY_CARE_PROVIDER_SITE_OTHER): Payer: BC Managed Care – PPO | Admitting: Internal Medicine

## 2014-02-07 ENCOUNTER — Other Ambulatory Visit (INDEPENDENT_AMBULATORY_CARE_PROVIDER_SITE_OTHER): Payer: BC Managed Care – PPO

## 2014-02-07 ENCOUNTER — Ambulatory Visit (INDEPENDENT_AMBULATORY_CARE_PROVIDER_SITE_OTHER): Payer: BC Managed Care – PPO | Admitting: *Deleted

## 2014-02-07 VITALS — BP 122/82 | HR 65 | Temp 98.0°F | Resp 16 | Ht 72.0 in | Wt 291.0 lb

## 2014-02-07 DIAGNOSIS — J301 Allergic rhinitis due to pollen: Secondary | ICD-10-CM

## 2014-02-07 DIAGNOSIS — E7800 Pure hypercholesterolemia, unspecified: Secondary | ICD-10-CM

## 2014-02-07 DIAGNOSIS — R7303 Prediabetes: Secondary | ICD-10-CM

## 2014-02-07 DIAGNOSIS — I10 Essential (primary) hypertension: Secondary | ICD-10-CM

## 2014-02-07 DIAGNOSIS — R7309 Other abnormal glucose: Secondary | ICD-10-CM

## 2014-02-07 DIAGNOSIS — R778 Other specified abnormalities of plasma proteins: Secondary | ICD-10-CM

## 2014-02-07 DIAGNOSIS — Z23 Encounter for immunization: Secondary | ICD-10-CM

## 2014-02-07 LAB — CBC WITH DIFFERENTIAL/PLATELET
Basophils Absolute: 0 10*3/uL (ref 0.0–0.1)
Basophils Relative: 0.4 % (ref 0.0–3.0)
Eosinophils Absolute: 0.1 10*3/uL (ref 0.0–0.7)
Eosinophils Relative: 1.2 % (ref 0.0–5.0)
HCT: 45.9 % (ref 39.0–52.0)
Hemoglobin: 14.2 g/dL (ref 13.0–17.0)
LYMPHS ABS: 1.1 10*3/uL (ref 0.7–4.0)
Lymphocytes Relative: 21.5 % (ref 12.0–46.0)
MCHC: 31 g/dL (ref 30.0–36.0)
MCV: 75.4 fl — ABNORMAL LOW (ref 78.0–100.0)
MONOS PCT: 15.6 % — AB (ref 3.0–12.0)
Monocytes Absolute: 0.8 10*3/uL (ref 0.1–1.0)
Neutro Abs: 3.2 10*3/uL (ref 1.4–7.7)
Neutrophils Relative %: 61.3 % (ref 43.0–77.0)
PLATELETS: 334 10*3/uL (ref 150.0–400.0)
RBC: 6.09 Mil/uL — ABNORMAL HIGH (ref 4.22–5.81)
RDW: 15.9 % — AB (ref 11.5–15.5)
WBC: 5.3 10*3/uL (ref 4.0–10.5)

## 2014-02-07 LAB — COMPREHENSIVE METABOLIC PANEL
ALBUMIN: 4 g/dL (ref 3.5–5.2)
ALT: 23 U/L (ref 0–53)
AST: 19 U/L (ref 0–37)
Alkaline Phosphatase: 70 U/L (ref 39–117)
BUN: 18 mg/dL (ref 6–23)
CALCIUM: 9.6 mg/dL (ref 8.4–10.5)
CO2: 27 meq/L (ref 19–32)
CREATININE: 1 mg/dL (ref 0.4–1.5)
Chloride: 107 mEq/L (ref 96–112)
GFR: 105.59 mL/min (ref >60.00)
GLUCOSE: 96 mg/dL (ref 70–99)
Potassium: 4.8 mEq/L (ref 3.5–5.1)
Sodium: 141 mEq/L (ref 135–145)
TOTAL PROTEIN: 8.2 g/dL (ref 6.0–8.3)
Total Bilirubin: 0.4 mg/dL (ref 0.2–1.2)

## 2014-02-07 LAB — LIPID PANEL
CHOL/HDL RATIO: 4
CHOLESTEROL: 149 mg/dL (ref 0–200)
HDL: 38.3 mg/dL — ABNORMAL LOW (ref >39.00)
LDL Cholesterol: 100 mg/dL — ABNORMAL HIGH (ref 0–99)
NonHDL: 110.7
Triglycerides: 52 mg/dL (ref 0.0–149.0)
VLDL: 10.4 mg/dL (ref 0.0–40.0)

## 2014-02-07 LAB — HEMOGLOBIN A1C: Hgb A1c MFr Bld: 6 % (ref 4.6–6.5)

## 2014-02-07 LAB — TSH: TSH: 1.14 u[IU]/mL (ref 0.35–4.50)

## 2014-02-07 MED ORDER — CETIRIZINE HCL 10 MG PO TABS: 10.0000 mg | ORAL_TABLET | Freq: Every day | ORAL | Status: DC

## 2014-02-07 MED ORDER — MOMETASONE FUROATE 50 MCG/ACT NA SUSP: 2.0000 | Freq: Every day | NASAL | Status: DC

## 2014-02-07 NOTE — Assessment & Plan Note (Signed)
I will check his A1C to see if he has developed DM2 

## 2014-02-07 NOTE — Assessment & Plan Note (Signed)
He has improved his lifestyle modifications and has lost around 40 pounds He has stopped taking the ARB and his BP is well controlled He will stay off of meds for now

## 2014-02-07 NOTE — Patient Instructions (Signed)

## 2014-02-07 NOTE — Assessment & Plan Note (Signed)
He has not been treating this Will start zyrtec and nasonex ns

## 2014-02-07 NOTE — Assessment & Plan Note (Signed)
He has stopped Vytorin His LDL is at an excellent levels

## 2014-02-07 NOTE — Progress Notes (Signed)
Subjective:    Patient ID: Tyler Copeland, male    DOB: 06-Oct-1975, 39 y.o.   MRN: [id number]  Hypertension This is a chronic problem. The current episode started more than 1 year ago. The problem has been resolved since onset. The problem is controlled. Pertinent negatives include no chest pain, malaise/fatigue, palpitations, peripheral edema or shortness of breath. Past treatments include angiotensin blockers. The current treatment provides significant improvement. There are no compliance problems.       Review of Systems  Constitutional: Negative.  Negative for fever, chills, malaise/fatigue, diaphoresis, appetite change and fatigue.  HENT: Positive for congestion, postnasal drip, rhinorrhea and sneezing. Negative for dental problem, facial swelling, nosebleeds, sinus pressure, sore throat, trouble swallowing and voice change.   Eyes: Negative.   Respiratory: Negative.  Negative for cough, choking, chest tightness, shortness of breath and stridor.   Cardiovascular: Negative.  Negative for chest pain, palpitations and leg swelling.  Gastrointestinal: Negative.  Negative for nausea, vomiting, abdominal pain, diarrhea and constipation.  Endocrine: Negative.   Genitourinary: Negative.   Musculoskeletal: Negative.  Negative for myalgias, back pain, joint swelling and arthralgias.  Skin: Negative.  Negative for rash.  Allergic/Immunologic: Negative.   Neurological: Negative.   Hematological: Negative.   Psychiatric/Behavioral: Negative.        Objective:   Physical Exam  Constitutional: He is oriented to person, place, and time. He appears well-developed and well-nourished. No distress.  HENT:  Head: Normocephalic and atraumatic.  Right Ear: Hearing, tympanic membrane, external ear and ear canal normal.  Left Ear: Hearing, tympanic membrane, external ear and ear canal normal.  Nose: No mucosal edema, rhinorrhea or nasal deformity. No epistaxis.  No foreign bodies. Right sinus  exhibits no maxillary sinus tenderness and no frontal sinus tenderness. Left sinus exhibits no maxillary sinus tenderness and no frontal sinus tenderness.  Mouth/Throat: Oropharynx is clear and moist and mucous membranes are normal. Mucous membranes are not pale, not dry and not cyanotic. No oral lesions. No trismus in the jaw. No uvula swelling. No oropharyngeal exudate, posterior oropharyngeal edema, posterior oropharyngeal erythema or tonsillar abscesses.  Eyes: Conjunctivae are normal. Right eye exhibits no discharge. Left eye exhibits no discharge. No scleral icterus.  Neck: Normal range of motion. Neck supple. No JVD present. No tracheal deviation present. No thyromegaly present.  Cardiovascular: Normal rate, regular rhythm, normal heart sounds and intact distal pulses.  Exam reveals no gallop and no friction rub.   No murmur heard. Pulmonary/Chest: Effort normal and breath sounds normal. No stridor. No respiratory distress. He has no wheezes. He has no rales. He exhibits no tenderness.  Abdominal: Soft. Bowel sounds are normal. He exhibits no distension and no mass. There is no tenderness. There is no rebound and no guarding.  Musculoskeletal: Normal range of motion. He exhibits no edema or tenderness.  Lymphadenopathy:    He has no cervical adenopathy.  Neurological: He is oriented to person, place, and time.  Skin: Skin is warm and dry. No rash noted. He is not diaphoretic. No erythema. No pallor.  Psychiatric: He has a normal mood and affect. His behavior is normal. Judgment and thought content normal.  Vitals reviewed.    Lab Results  Component Value Date   WBC 5.9 02/03/2012   HGB 14.2 02/03/2012   HCT 44.2 02/03/2012   PLT 292.0 02/03/2012   GLUCOSE 81 04/05/2012   CHOL 169 02/03/2012   TRIG 72.0 02/03/2012   HDL 48.30 02/03/2012   LDLDIRECT 163.8 07/20/2010  LDLCALC 106* 02/03/2012   ALT 25 04/05/2012   AST 24 04/05/2012   NA 137 04/05/2012   K 4.1 04/05/2012   CL 100  04/05/2012   CREATININE 1.1 04/05/2012   BUN 13 04/05/2012   CO2 29 04/05/2012   TSH 1.44 02/03/2012   HGBA1C 6.3 04/10/2013   MICROALBUR 10.1* 02/03/2012       Assessment & Plan:

## 2014-02-17 ENCOUNTER — Telehealth: Payer: Self-pay | Admitting: Internal Medicine

## 2014-02-17 DIAGNOSIS — G4730 Sleep apnea, unspecified: Secondary | ICD-10-CM | POA: Insufficient documentation

## 2014-02-17 NOTE — Telephone Encounter (Signed)
Is requesting a sleep study referral to [place], Dr.Domeiher.

## 2014-03-14 ENCOUNTER — Encounter: Payer: Self-pay | Admitting: Internal Medicine

## 2014-03-14 ENCOUNTER — Ambulatory Visit (INDEPENDENT_AMBULATORY_CARE_PROVIDER_SITE_OTHER): Payer: BC Managed Care – PPO | Admitting: Internal Medicine

## 2014-03-14 VITALS — BP 120/86 | HR 74 | Temp 98.2°F | Ht 72.0 in | Wt 292.2 lb

## 2014-03-14 DIAGNOSIS — E663 Overweight: Secondary | ICD-10-CM

## 2014-03-14 DIAGNOSIS — B369 Superficial mycosis, unspecified: Secondary | ICD-10-CM

## 2014-03-14 DIAGNOSIS — J309 Allergic rhinitis, unspecified: Secondary | ICD-10-CM

## 2014-03-14 MED ORDER — KETOCONAZOLE 2 % EX CREA: 1.0000 "application " | TOPICAL_CREAM | Freq: Every day | CUTANEOUS | Status: DC

## 2014-03-14 MED ORDER — MOMETASONE FUROATE 50 MCG/ACT NA SUSP: 2.0000 | Freq: Every day | NASAL | Status: DC

## 2014-03-14 NOTE — Progress Notes (Signed)
   Subjective:    Patient ID: Tyler Copeland, male    DOB: 03/03/1975, 39 y.o.   MRN: [id number]  HPI Symptoms began 2 days ago as postnasal drainage associated with itchy, watery eyes as well as sneezing. He's had some head congestion.  He previously took allergy shots but these have not been administered for at least 6 months.  He treated his symptoms with herbal tea.  He's also concerned about a lesion over the right lower extremity which is itching. He's been treating this with alcohol topically  Review of Systems He denies frontal headache, facial pain, nasal purulence, fever, chills, sweats, cough, shortness breath, wheezing.    Objective:   Physical Exam  Positive or pertinent findings include: Nares are erythematous and dry. He has a 3 x 3.5 cm slightly scaly lesion with a serpiginous border, clinically suggesting fungal infection.  General appearance:BMI 39.63.Adequately nourished; no acute distress or increased work of breathing is present.  No  lymphadenopathy about the head, neck, or axilla noted.  Eyes: No conjunctival inflammation or lid edema is present. There is no scleral icterus. Ears:  External ear exam shows no significant lesions or deformities.  Otoscopic examination reveals clear canals, tympanic membranes are intact bilaterally without bulging, retraction, inflammation or discharge. Nose:  External nasal examination shows no deformity or inflammation. No septal dislocation or deviation.No obstruction to airflow.  Oral exam: Dental hygiene is good; lips and gums are healthy appearing.There is no oropharyngeal erythema or exudate noted.  Neck:  No deformities, thyromegaly, masses, or tenderness noted.   Supple with full range of motion without pain.  Heart:  Normal rate and regular rhythm. S1 and S2 normal without gallop, murmur, click, rub or other extra sounds.  Lungs:Chest clear to auscultation; no wheezes, rhonchi,rales ,or rubs present. Extremities:  No  cyanosis, edema, or clubbing  noted  Skin: Warm & dry w/o  tenting.       Assessment & Plan:  #1 allergic rhinitis #2 fungal dermatitis See orders & AVS

## 2014-03-14 NOTE — Progress Notes (Signed)
Pre visit review using our clinic review tool, if applicable. No additional management support is needed unless otherwise documented below in the visit note. 

## 2014-03-14 NOTE — Patient Instructions (Addendum)
Plain Mucinex (NOT D) for thick secretions ;force NON dairy fluids .   Nasal cleansing in the shower as discussed with lather of mild shampoo.After 10 seconds wash off lather while  exhaling through nostrils. Make sure that all residual soap is removed to prevent irritation.  Flonase OR Nasacort AQ 1 spray in each nostril twice a day as needed. Use the "crossover" technique into opposite nostril spraying toward opposite ear @ 45 degree angle, not straight up into nostril.  Plain Allegra (NOT D )  160 daily , Loratidine 10 mg , OR Zyrtec 10 mg @ bedtime  as needed for itchy eyes & sneezing.  Zicam Melts or Zinc lozenges as per package label for sore throat . Report fever; discolored nasal or chest secretions; or frontal headache or facial pain  present

## 2014-03-26 ENCOUNTER — Institutional Professional Consult (permissible substitution): Payer: Self-pay | Admitting: Neurology

## 2014-04-25 ENCOUNTER — Ambulatory Visit (INDEPENDENT_AMBULATORY_CARE_PROVIDER_SITE_OTHER): Payer: BC Managed Care – PPO | Admitting: Neurology

## 2014-04-25 ENCOUNTER — Encounter: Payer: Self-pay | Admitting: Neurology

## 2014-04-25 VITALS — BP 122/82 | HR 72 | Resp 16 | Ht 71.75 in | Wt 293.8 lb

## 2014-04-25 DIAGNOSIS — J301 Allergic rhinitis due to pollen: Secondary | ICD-10-CM

## 2014-04-25 DIAGNOSIS — G4730 Sleep apnea, unspecified: Secondary | ICD-10-CM

## 2014-04-25 DIAGNOSIS — G4710 Hypersomnia, unspecified: Secondary | ICD-10-CM | POA: Diagnosis not present

## 2014-04-25 DIAGNOSIS — G4700 Insomnia, unspecified: Secondary | ICD-10-CM

## 2014-04-25 DIAGNOSIS — Z6835 Body mass index (BMI) 35.0-35.9, adult: Secondary | ICD-10-CM | POA: Diagnosis not present

## 2014-04-25 NOTE — Progress Notes (Signed)
SLEEP MEDICINE CLINIC   Provider:  Larey Seat, M D  Referring Provider: Janith Lima, MD Primary Care Physician:  Scarlette Calico, MD  Chief Complaint  Patient presents with  . NP Scarlette Calico, MD  Sleep Consult for apnea    Rm 11, alone    HPI:  Tyler Copeland is a 39 y.o. male , seen here as a referral from Dr. Ronnald Ramp for evaluation of insomnia with apnea.    Tyler Copeland is seen here today on 04-25-14 with a chief complaint of having trouble to initiate sleep and having  witnessed snoring and apnea . The patient also reports that he feels unable to maintain sleep without using some kind of sleep aid often he uses Benadryl for this purpose. He has allergic rhinitis as well. His BMI is 40.  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 He feels his mind does not "shut down' and he worries and thinks on an on. He usually goes to bed around 11:30. It'll take him a while to go to sleep ( 1 hour )and that's why he started to take Benadryl but the Benadryl has left him groggy and somewhat hung over in the daytime , too.He has a TV running in the background in his bedroat night, the bedroom is cool at about 68-6F. But is neither quiet nor dark.    His goal is to start working out in the morning before he goes to school so that he also will support his weight loss program. He has been diagnosed with pre-diabetes and he wants to combat this. His alarm rings at 5:00 in the morning and under the influence of Benadryl he has a been feeling extremely drowsy to leave the bed. He has 3-4 bathroom breaks in his rather short nocturnal sleep time of 4-1/2 hours.  He is resistant to any changes as to his TV habits. During the school year he finds no time to nap during summer break he will occasionally take a daytime nap. His daytime naps are often more refreshing than his nocturnal sleep.  The patient has some seasonal allergies nasal congestion and sinus disease  and doing allergy season or an infection of the upper airway he usually mouth breathes. That's also the time but he wakes up with morning headaches. It is not a daily occurrence.    Review of Systems: Out of a complete 14 system review, the patient complains of only the following symptoms, and all other reviewed systems are negative.  snoring, apnea witnessed. More when he has 70 pounds  , from 350  Now 290.   Epworth score 15 , Fatigue severity score 28  , depression score n/a   History   Social History  . Marital Status: Married    Spouse Name: Joelene Millin  . Number of Children: N/A  . Years of Education: BS   Occupational History  . High School teacher and coach    Social History Main Topics  . Smoking status: Never Smoker   . Smokeless tobacco: Never Used  . Alcohol Use: No  . Drug Use: No  . Sexual Activity: Not Currently   Other Topics Concern  . Not on file   Social History Narrative   Regular exercise-No   Caffeine Herbal tea, some 2 cups  occasionall of caffeine.       Family History  Problem Relation Age of Onset  . Diabetes Other   . Hypertension Other   .  Cancer Neg Hx   . Early death Neg Hx   . Heart disease Neg Hx   . Hyperlipidemia Neg Hx   . Kidney disease Neg Hx   . Stroke Neg Hx   . Alcohol abuse Neg Hx     Past Medical History  Diagnosis Date  . Plantar fasciitis   . Pemphigus   . Diabetes mellitus without complication   . Hyperlipidemia   . Hypertension     Past Surgical History  Procedure Laterality Date  . Foot surgery Bilateral 2012    Arches    Current Outpatient Prescriptions  Medication Sig Dispense Refill  . cetirizine (ZYRTEC) 10 MG tablet Take 1 tablet (10 mg total) by mouth daily. (Patient taking differently: Take 10 mg by mouth daily as needed. ) 30 tablet 11  . ketoconazole (NIZORAL) 2 % cream Apply 1 application topically daily. 15 g 0  . mometasone (NASONEX) 50 MCG/ACT nasal spray Place 2 sprays into the nose daily.  (Patient taking differently: Place 2 sprays into the nose daily as needed. ) 17 g 12  . triamcinolone ointment (KENALOG) 0.1 %      No current facility-administered medications for this visit.    Allergies as of 04/25/2014 - Review Complete 04/25/2014  Allergen Reaction Noted  . Codeine  07/20/2010  . Hydrocodone-acetaminophen      Vitals: BP 122/82 mmHg  Pulse 72  Resp 16  Ht 5' 11.75" (1.822 m)  Wt 293 lb 12.8 oz (133.267 kg)  BMI 40.14 kg/m2 Last Weight:  Wt Readings from Last 1 Encounters:  04/25/14 293 lb 12.8 oz (133.267 kg)       Last Height:   Ht Readings from Last 1 Encounters:  04/25/14 5' 11.75" (1.822 m)    Physical exam:  General: The patient is awake, alert and appears not in acute distress. The patient is well groomed. Head: Normocephalic, atraumatic. Neck is supple. Mallampati  4 plus  , With remaining tonsils. ,  neck circumference: 19. Nasal airflow congested , TMJ is not  evident . Retrognathia is seen.  Cardiovascular:  Regular rate and rhythm, without  murmurs or carotid bruit, and without distended neck veins. Respiratory: Lungs are clear to auscultation. Skin:  Without evidence of edema, or rash Trunk: BMI is elevated and patient  has normal posture.  Neurologic exam : The patient is awake and alert, oriented to place and time.   Memory subjective  described as intact. There is a normal attention span & concentration ability. Speech is fluent without  dysarthria, dysphonia or aphasia. Mood and affect are appropriate.  Cranial nerves: Pupils are equal and briskly reactive to light. Funduscopic exam without  evidence of pallor or edema. Extraocular movements  in vertical and horizontal planes intact and without nystagmus. Visual fields by finger perimetry are intact. Hearing to finger rub intact.  Facial sensation intact to fine touch. Facial motor strength is symmetric and tongue and uvula move midline.  Motor exam:   Regular  tone , muscle bulk and  symmetric strength in all extremities.  Sensory:  Fine touch, pinprick and vibration were tested in all extremities. Proprioception is  normal.  Coordination: Rapid alternating movements in the fingers/hands is normal. Finger-to-nose maneuver  normal without evidence of ataxia, dysmetria or tremor.  Gait and station: Patient walks without assistive device and is able unassisted to climb up to the exam table. Strength within normal limits.  Stance is stable and normal.  Tandem gait is unfragmented. Romberg testing is  negative.  Deep tendon reflexes: in the  upper and lower extremities are symmetric and intact. Babinski maneuver response is downgoing.   Assessment:  After physical and neurologic examination, review of laboratory studies, imaging, neurophysiology testing and pre-existing records, assessment is  Tyler Copeland most likely has obstructive sleep apnea. He has been witnessed to snore and has had witnessed apneas but usually his snoring has been loudest when in supine sleep position. He has developed a habit of sleeping prone. In addition he has a very high-grade Mallampati and a large neck circumference. As he is working actively on reducing his body mass index is his major risk factor his upper airway restriction will still remain. In addition he does have some nasal congestion during allergy season. The Benadryl may have helped with that as well but he woke up with an extremely dry mouth and feeling groggy in the morning. I think that his insomnia to initiate sleep is not related to the apnea but that his frequent interruptions from sleep such as the frequent nocturia can well be related to the condition. If sleep apnea is found I would like to put this patient on an auto titrate her as he is still losing more weight and will need a flexible machine to accompany the pressure needs according to his body weight changes.   The patient was advised of the nature of the diagnosed sleep disorder ,  the treatment options and risks for general a health and wellness arising from not treating the condition. Visit duration was 30 minutes.   Plan:  Treatment plan and additional workup :     Larey Seat MD  04/25/2014

## 2014-04-25 NOTE — Progress Notes (Deleted)
Subjective:    Patient ID: Tyler Copeland is a 39 y.o. male.  HPI {Common ambulatory SmartLinks:[id number]}  Review of Systems  Objective:  Neurologic Exam  Physical Exam  Assessment:   ***  Plan:   ***

## 2014-06-22 ENCOUNTER — Ambulatory Visit (INDEPENDENT_AMBULATORY_CARE_PROVIDER_SITE_OTHER): Payer: BC Managed Care – PPO | Admitting: Neurology

## 2014-06-22 DIAGNOSIS — G4730 Sleep apnea, unspecified: Secondary | ICD-10-CM | POA: Diagnosis not present

## 2014-06-22 DIAGNOSIS — J301 Allergic rhinitis due to pollen: Secondary | ICD-10-CM

## 2014-06-22 DIAGNOSIS — G4700 Insomnia, unspecified: Secondary | ICD-10-CM

## 2014-06-22 DIAGNOSIS — G4710 Hypersomnia, unspecified: Secondary | ICD-10-CM

## 2014-06-23 NOTE — Sleep Study (Signed)
Please see the scanned sleep study interpretation located in the Procedure tab within the Chart Review section. 

## 2014-07-03 ENCOUNTER — Telehealth: Payer: Self-pay

## 2014-07-03 DIAGNOSIS — G4733 Obstructive sleep apnea (adult) (pediatric): Secondary | ICD-10-CM

## 2014-07-03 NOTE — Telephone Encounter (Signed)
Called pt with sleep study results. Informed him that Dr. Brett Fairy recommended starting a cpap. Pt is agreeable to proceeding. Insurance is Nurse, mental health. Pt has no preference on DME. Will send to [facility]. Informed pt to wear cpap 4 or more hours nightly and to bring cpap to all appointments. Made a f/u appt on 8/1 for insurance purposes.

## 2014-07-23 ENCOUNTER — Telehealth: Payer: Self-pay

## 2014-07-23 NOTE — Telephone Encounter (Signed)
Pt was a no show for his cpap set up appt with [facility].

## 2014-09-01 ENCOUNTER — Telehealth: Payer: Self-pay

## 2014-09-01 ENCOUNTER — Ambulatory Visit: Payer: Self-pay | Admitting: Neurology

## 2014-09-01 NOTE — Telephone Encounter (Signed)
Pt did not show for their appt with Dr. Dohmeier today.  

## 2014-09-04 ENCOUNTER — Encounter: Payer: Self-pay | Admitting: Neurology

## 2014-11-29 ENCOUNTER — Ambulatory Visit (INDEPENDENT_AMBULATORY_CARE_PROVIDER_SITE_OTHER): Payer: BC Managed Care – PPO | Admitting: Family Medicine

## 2014-11-29 VITALS — BP 128/88 | HR 60 | Temp 97.9°F | Ht 71.75 in | Wt 279.8 lb

## 2014-11-29 DIAGNOSIS — J018 Other acute sinusitis: Secondary | ICD-10-CM

## 2014-11-29 DIAGNOSIS — J209 Acute bronchitis, unspecified: Secondary | ICD-10-CM | POA: Diagnosis not present

## 2014-11-29 MED ORDER — AMOXICILLIN-POT CLAVULANATE 875-125 MG PO TABS: 1.0000 | ORAL_TABLET | Freq: Two times a day (BID) | ORAL | Status: AC

## 2014-11-29 NOTE — Progress Notes (Signed)
Pre visit review using our clinic review tool, if applicable. No additional management support is needed unless otherwise documented below in the visit note. 

## 2014-11-29 NOTE — Patient Instructions (Signed)

## 2014-11-29 NOTE — Progress Notes (Signed)
   Subjective:    Patient ID: Tyler Copeland, male    DOB: [birth year], 39 y.o.   MRN: [id number]  HPI  Patient seen for acute Saturday clinic visit. Nonsmoker. Three-week history of productive cough and bilateral maxillary sinus congestion with yellow sputum. He had some yellowish nasal discharge. Occasional headaches. No fevers or chills. Increased malaise. Using over-the-counter cough medications with fairly good control. He is allergic to hydrocodone. No known antibiotic allergies. No relief with decongestants or antihistamines.  Past Medical History  Diagnosis Date  . Plantar fasciitis   . Pemphigus   . Diabetes mellitus without complication   . Hyperlipidemia   . Hypertension    Past Surgical History  Procedure Laterality Date  . Foot surgery Bilateral 2012    Arches    reports that he has never smoked. He has never used smokeless tobacco. He reports that he does not drink alcohol or use illicit drugs. family history includes Diabetes in his other; Hypertension in his other. There is no history of Cancer, Early death, Heart disease, Hyperlipidemia, Kidney disease, Stroke, or Alcohol abuse. Allergies  Allergen Reactions  . Codeine     Codeine- Hallucinations  . Hydrocodone-Acetaminophen     Visual hallucinations & "shakes"     Review of Systems  Constitutional: Positive for fatigue. Negative for fever and chills.  HENT: Positive for congestion and sinus pressure.   Respiratory: Positive for cough.        Objective:   Physical Exam  Constitutional: He appears well-developed and well-nourished.  HENT:  Right Ear: External ear normal.  Left Ear: External ear normal.  Mouth/Throat: Oropharynx is clear and moist.  Neck: Neck supple.  Cardiovascular: Normal rate and regular rhythm.   Pulmonary/Chest: Effort normal and breath sounds normal. No respiratory distress. He has no wheezes. He has no rales.  Lymphadenopathy:    He has no cervical adenopathy.            Assessment & Plan:  Acute sinusitis/bronchitis. Given duration of symptoms start Augmentin 875 mg twice daily for 10 days. Follow-up with primary if not improved over the next 1-2 weeks

## 2014-12-24 ENCOUNTER — Encounter: Payer: Self-pay | Admitting: Internal Medicine

## 2014-12-24 ENCOUNTER — Ambulatory Visit (INDEPENDENT_AMBULATORY_CARE_PROVIDER_SITE_OTHER): Payer: BC Managed Care – PPO | Admitting: Internal Medicine

## 2014-12-24 ENCOUNTER — Other Ambulatory Visit (INDEPENDENT_AMBULATORY_CARE_PROVIDER_SITE_OTHER): Payer: BC Managed Care – PPO

## 2014-12-24 VITALS — BP 116/80 | HR 66 | Temp 97.3°F | Resp 16 | Ht 71.0 in | Wt 284.0 lb

## 2014-12-24 DIAGNOSIS — Z23 Encounter for immunization: Secondary | ICD-10-CM | POA: Diagnosis not present

## 2014-12-24 DIAGNOSIS — R7303 Prediabetes: Secondary | ICD-10-CM

## 2014-12-24 DIAGNOSIS — J301 Allergic rhinitis due to pollen: Secondary | ICD-10-CM

## 2014-12-24 DIAGNOSIS — R778 Other specified abnormalities of plasma proteins: Secondary | ICD-10-CM

## 2014-12-24 DIAGNOSIS — Z0000 Encounter for general adult medical examination without abnormal findings: Secondary | ICD-10-CM | POA: Insufficient documentation

## 2014-12-24 DIAGNOSIS — R059 Cough, unspecified: Secondary | ICD-10-CM

## 2014-12-24 DIAGNOSIS — R05 Cough: Secondary | ICD-10-CM

## 2014-12-24 DIAGNOSIS — J309 Allergic rhinitis, unspecified: Secondary | ICD-10-CM

## 2014-12-24 DIAGNOSIS — J0101 Acute recurrent maxillary sinusitis: Secondary | ICD-10-CM | POA: Insufficient documentation

## 2014-12-24 LAB — COMPREHENSIVE METABOLIC PANEL
ALK PHOS: 70 U/L (ref 39–117)
ALT: 18 U/L (ref 0–53)
AST: 15 U/L (ref 0–37)
Albumin: 3.6 g/dL (ref 3.5–5.2)
BUN: 11 mg/dL (ref 6–23)
CO2: 31 meq/L (ref 19–32)
Calcium: 9.5 mg/dL (ref 8.4–10.5)
Chloride: 103 mEq/L (ref 96–112)
Creatinine, Ser: 0.82 mg/dL (ref 0.40–1.50)
GFR: 133.7 mL/min (ref >60.00)
GLUCOSE: 92 mg/dL (ref 70–99)
POTASSIUM: 4.8 meq/L (ref 3.5–5.1)
Sodium: 141 mEq/L (ref 135–145)
Total Bilirubin: 0.3 mg/dL (ref 0.2–1.2)
Total Protein: 7.3 g/dL (ref 6.0–8.3)

## 2014-12-24 LAB — LIPID PANEL
CHOL/HDL RATIO: 3
Cholesterol: 137 mg/dL (ref 0–200)
HDL: 48.3 mg/dL (ref >39.00)
LDL CALC: 79 mg/dL (ref 0–99)
NONHDL: 88.3
Triglycerides: 45 mg/dL (ref 0.0–149.0)
VLDL: 9 mg/dL (ref 0.0–40.0)

## 2014-12-24 LAB — CBC WITH DIFFERENTIAL/PLATELET
Basophils Absolute: 0 10*3/uL (ref 0.0–0.1)
Basophils Relative: 0.3 % (ref 0.0–3.0)
EOS PCT: 1.5 % (ref 0.0–5.0)
Eosinophils Absolute: 0.1 10*3/uL (ref 0.0–0.7)
HEMATOCRIT: 43.7 % (ref 39.0–52.0)
Hemoglobin: 13.8 g/dL (ref 13.0–17.0)
LYMPHS ABS: 1.3 10*3/uL (ref 0.7–4.0)
LYMPHS PCT: 19.7 % (ref 12.0–46.0)
MCHC: 31.6 g/dL (ref 30.0–36.0)
MCV: 76.1 fl — AB (ref 78.0–100.0)
MONOS PCT: 15.6 % — AB (ref 3.0–12.0)
Monocytes Absolute: 1 10*3/uL (ref 0.1–1.0)
NEUTROS ABS: 4 10*3/uL (ref 1.4–7.7)
NEUTROS PCT: 62.9 % (ref 43.0–77.0)
Platelets: 318 10*3/uL (ref 150.0–400.0)
RBC: 5.74 Mil/uL (ref 4.22–5.81)
RDW: 15.3 % (ref 11.5–15.5)
WBC: 6.4 10*3/uL (ref 4.0–10.5)

## 2014-12-24 LAB — PSA: PSA: 1.06 ng/mL (ref 0.10–4.00)

## 2014-12-24 LAB — HEMOGLOBIN A1C: Hgb A1c MFr Bld: 5.8 % (ref 4.6–6.5)

## 2014-12-24 LAB — FECAL OCCULT BLOOD, GUAIAC: Fecal Occult Blood: NEGATIVE

## 2014-12-24 LAB — TSH: TSH: 1.21 u[IU]/mL (ref 0.35–4.50)

## 2014-12-24 MED ORDER — LEVOCETIRIZINE DIHYDROCHLORIDE 5 MG PO TABS: 5.0000 mg | ORAL_TABLET | Freq: Every evening | ORAL | Status: DC

## 2014-12-24 MED ORDER — METHYLPREDNISOLONE ACETATE 80 MG/ML IJ SUSP
120.0000 mg | Freq: Once | INTRAMUSCULAR | Status: AC
  Administered 2014-12-24: 120 mg via INTRAMUSCULAR

## 2014-12-24 MED ORDER — LEVOFLOXACIN 750 MG PO TABS: 750.0000 mg | ORAL_TABLET | Freq: Every day | ORAL | Status: AC

## 2014-12-24 MED ORDER — MOMETASONE FUROATE 50 MCG/ACT NA SUSP: 4.0000 | Freq: Every day | NASAL | Status: DC

## 2014-12-24 NOTE — Progress Notes (Signed)
Pre visit review using our clinic review tool, if applicable. No additional management support is needed unless otherwise documented below in the visit note. 

## 2014-12-24 NOTE — Progress Notes (Signed)
Subjective:  Patient ID: Tyler Copeland, male    DOB: 1975-10-03  Age: 38 y.o. MRN: [id number]  CC: Annual Exam; Cough; Sinusitis; and Allergic Rhinitis    HPI Tyler Copeland presents for a CPX but he does not feel well- he was seen a few weeks ago for URI and was treated with Augmentin. His symptoms persist with facial pain, cough prod of yellow phlegm, thick yellow/green nasal phlegm, chills, nasal congestion, and sneezing. He has not been using his usual allergy medications.  Outpatient Prescriptions Prior to Visit  Medication Sig Dispense Refill  . triamcinolone ointment (KENALOG) 0.1 %     . cetirizine (ZYRTEC) 10 MG tablet Take 1 tablet (10 mg total) by mouth daily. (Patient not taking: Reported on 12/24/2014) 30 tablet 11  . ketoconazole (NIZORAL) 2 % cream Apply 1 application topically daily. (Patient not taking: Reported on 12/24/2014) 15 g 0  . mometasone (NASONEX) 50 MCG/ACT nasal spray Place 2 sprays into the nose daily. (Patient not taking: Reported on 12/24/2014) 17 g 12   No facility-administered medications prior to visit.    ROS Review of Systems  Constitutional: Positive for chills. Negative for fever, diaphoresis, activity change, appetite change, fatigue and unexpected weight change.  HENT: Positive for congestion, postnasal drip, rhinorrhea and sinus pressure. Negative for facial swelling, nosebleeds, sneezing, sore throat and trouble swallowing.   Eyes: Negative.   Respiratory: Positive for cough. Negative for apnea, choking, chest tightness, shortness of breath and wheezing.   Cardiovascular: Negative.  Negative for chest pain, palpitations and leg swelling.  Gastrointestinal: Negative.  Negative for nausea, vomiting, abdominal pain, diarrhea, constipation and blood in stool.  Endocrine: Negative.   Genitourinary: Negative.   Musculoskeletal: Negative.   Skin: Negative.  Negative for color change and rash.  Allergic/Immunologic: Negative.   Neurological:  Negative.   Hematological: Negative.   Psychiatric/Behavioral: Negative.     Objective:  BP 116/80 mmHg  Pulse 66  Temp(Src) 97.3 F (36.3 C) (Oral)  Resp 16  Ht 5\' 11"  (1.803 m)  Wt 284 lb (128.822 kg)  BMI 39.63 kg/m2  SpO2 97%  BP Readings from Last 3 Encounters:  12/24/14 116/80  11/29/14 128/88  04/25/14 122/82    Wt Readings from Last 3 Encounters:  12/24/14 284 lb (128.822 kg)  11/29/14 279 lb 12 oz (126.894 kg)  04/25/14 293 lb 12.8 oz (133.267 kg)    Physical Exam  Constitutional: He is oriented to person, place, and time.  Non-toxic appearance. He does not have a sickly appearance. He does not appear ill. No distress.  HENT:  Head: Normocephalic and atraumatic.  Nose: Mucosal edema and rhinorrhea present. No sinus tenderness or nasal deformity. Right sinus exhibits maxillary sinus tenderness. Right sinus exhibits no frontal sinus tenderness. Left sinus exhibits no maxillary sinus tenderness and no frontal sinus tenderness.  Mouth/Throat: Oropharynx is clear and moist and mucous membranes are normal. Mucous membranes are not pale, not dry and not cyanotic. No oral lesions. No trismus in the jaw. No uvula swelling. No oropharyngeal exudate, posterior oropharyngeal edema, posterior oropharyngeal erythema or tonsillar abscesses.  Eyes: Conjunctivae are normal. Right eye exhibits no discharge. Left eye exhibits no discharge. No scleral icterus.  Neck: Normal range of motion. Neck supple. No JVD present. No tracheal deviation present. No thyromegaly present.  Cardiovascular: Normal rate, regular rhythm, normal heart sounds and intact distal pulses.  Exam reveals no gallop and no friction rub.   No murmur heard. Pulmonary/Chest: Effort normal and breath sounds  normal. No stridor. No respiratory distress. He has no wheezes. He has no rales. He exhibits no tenderness.  Abdominal: Soft. Bowel sounds are normal. He exhibits no distension and no mass. There is no tenderness.  There is no rebound and no guarding. Hernia confirmed negative in the right inguinal area and confirmed negative in the left inguinal area.  Genitourinary: Testes normal and penis normal. Right testis shows no mass, no swelling and no tenderness. Right testis is descended. Left testis shows no mass, no swelling and no tenderness. Left testis is descended. Circumcised. No penile erythema or penile tenderness. No discharge found.  Musculoskeletal: Normal range of motion. He exhibits no edema or tenderness.  Lymphadenopathy:    He has no cervical adenopathy.       Right: No inguinal adenopathy present.       Left: No inguinal adenopathy present.  Neurological: He is oriented to person, place, and time.  Skin: Skin is warm and dry. No rash noted. He is not diaphoretic. No erythema. No pallor.  Vitals reviewed.   Lab Results  Component Value Date   WBC 6.4 12/24/2014   HGB 13.8 12/24/2014   HCT 43.7 12/24/2014   PLT 318.0 12/24/2014   GLUCOSE 92 12/24/2014   CHOL 137 12/24/2014   TRIG 45.0 12/24/2014   HDL 48.30 12/24/2014   LDLDIRECT 163.8 07/20/2010   LDLCALC 79 12/24/2014   ALT 18 12/24/2014   AST 15 12/24/2014   NA 141 12/24/2014   K 4.8 12/24/2014   CL 103 12/24/2014   CREATININE 0.82 12/24/2014   BUN 11 12/24/2014   CO2 31 12/24/2014   TSH 1.21 12/24/2014   PSA 1.06 12/24/2014   HGBA1C 5.8 12/24/2014   MICROALBUR 10.1* 02/03/2012    No results found.  Assessment & Plan:   Tyler Copeland was seen today for annual exam, cough, sinusitis and allergic rhinitis .  Diagnoses and all orders for this visit:  Elevated total protein- this is normal now -     SPEP & IFE with QIG; Future  Pre-diabetes- his blood sugars are stable, no medications are needed, he will cont to work on his lifestyle modifications -     Hemoglobin A1c; Future  Routine general medical examination at a health care facility- exam done, vaccines were reviewed and updated, labs ordered, pt ed material was  given -     Lipid panel; Future -     Comprehensive metabolic panel; Future -     CBC with Differential/Platelet; Future -     TSH; Future -     PSA; Future  Cough  Non-seasonal allergic rhinitis due to pollen- he is having a flare of his symptoms, he was given an injection of depo-medrol IM, will restart an antihistamine and steroid ns. -     mometasone (NASONEX) 50 MCG/ACT nasal spray; Place 4 sprays into the nose daily. -     levocetirizine (XYZAL) 5 MG tablet; Take 1 tablet (5 mg total) by mouth every evening.  Allergic rhinitis, unspecified allergic rhinitis type -     mometasone (NASONEX) 50 MCG/ACT nasal spray; Place 4 sprays into the nose daily. -     levocetirizine (XYZAL) 5 MG tablet; Take 1 tablet (5 mg total) by mouth every evening. -     methylPREDNISolone acetate (DEPO-MEDROL) injection 120 mg; Inject 1.5 mLs (120 mg total) into the muscle once.  Prediabetes  Acute recurrent maxillary sinusitis- he has failed augmentin, will treat with levaquin -     levofloxacin (  LEVAQUIN) 750 MG tablet; Take 1 tablet (750 mg total) by mouth daily. -     methylPREDNISolone acetate (DEPO-MEDROL) injection 120 mg; Inject 1.5 mLs (120 mg total) into the muscle once.  Encounter for immunization  Other orders -     Flu Vaccine QUAD 36+ mos IM  I have discontinued Mr. Stute ketoconazole. I have changed his cetirizine to levocetirizine. I have also changed his mometasone. Additionally, I am having him start on levofloxacin. Lastly, I am having him maintain his triamcinolone ointment. We administered methylPREDNISolone acetate.  Meds ordered this encounter  Medications  . mometasone (NASONEX) 50 MCG/ACT nasal spray    Sig: Place 4 sprays into the nose daily.    Dispense:  17 g    Refill:  11  . levocetirizine (XYZAL) 5 MG tablet    Sig: Take 1 tablet (5 mg total) by mouth every evening.    Dispense:  90 tablet    Refill:  3  . levofloxacin (LEVAQUIN) 750 MG tablet    Sig: Take 1  tablet (750 mg total) by mouth daily.    Dispense:  10 tablet    Refill:  0  . methylPREDNISolone acetate (DEPO-MEDROL) injection 120 mg    Sig:      Follow-up: Return in about 3 weeks (around 01/14/2015).  Scarlette Calico, MD

## 2014-12-24 NOTE — Patient Instructions (Signed)

## 2014-12-29 ENCOUNTER — Encounter: Payer: Self-pay | Admitting: Internal Medicine

## 2014-12-29 LAB — SPEP & IFE WITH QIG
ALPHA-1-GLOBULIN: 0.4 g/dL — AB (ref 0.2–0.3)
ALPHA-2-GLOBULIN: 0.8 g/dL (ref 0.5–0.9)
Albumin ELP: 3.9 g/dL (ref 3.8–4.8)
BETA 2: 0.6 g/dL — AB (ref 0.2–0.5)
BETA GLOBULIN: 0.5 g/dL (ref 0.4–0.6)
GAMMA GLOBULIN: 1.8 g/dL — AB (ref 0.8–1.7)
IGG (IMMUNOGLOBIN G), SERUM: 1850 mg/dL — AB (ref 650–1600)
IgA: 423 mg/dL — ABNORMAL HIGH (ref 68–379)
IgM, Serum: 49 mg/dL (ref 41–251)
Total Protein, Serum Electrophoresis: 8 g/dL (ref 6.1–8.1)

## 2015-03-25 ENCOUNTER — Encounter: Payer: Self-pay | Admitting: Family Medicine

## 2015-03-25 ENCOUNTER — Ambulatory Visit: Payer: BC Managed Care – PPO | Admitting: Family Medicine

## 2015-03-25 ENCOUNTER — Ambulatory Visit (INDEPENDENT_AMBULATORY_CARE_PROVIDER_SITE_OTHER): Payer: BC Managed Care – PPO | Admitting: Family Medicine

## 2015-03-25 VITALS — BP 130/100 | HR 89 | Temp 99.1°F | Ht 71.0 in | Wt 300.6 lb

## 2015-03-25 DIAGNOSIS — J0191 Acute recurrent sinusitis, unspecified: Secondary | ICD-10-CM | POA: Diagnosis not present

## 2015-03-25 MED ORDER — CEFDINIR 300 MG PO CAPS: 300.0000 mg | ORAL_CAPSULE | Freq: Two times a day (BID) | ORAL | Status: DC

## 2015-03-25 MED ORDER — PREDNISONE 20 MG PO TABS: ORAL_TABLET | ORAL | Status: DC

## 2015-03-25 NOTE — Progress Notes (Signed)
Pre visit review using our clinic review tool, if applicable. No additional management support is needed unless otherwise documented below in the visit note. 

## 2015-03-25 NOTE — Patient Instructions (Signed)

## 2015-03-25 NOTE — Progress Notes (Signed)
   Subjective:    Patient ID: Tyler Copeland, male    DOB: 10/29/1975, 40 y.o.   MRN: [id number]  HPI  Patient seen for acute visit He describes pansinusitis symptoms for the past several weeks. Back in October history with Augmentin but did not see any improvement. He subsequently was seen in November and treated with Levaquin and steroids and did see some slight improvement but not clear he had ever total resolution.  Now presents with bilateral frontal and maxillary sinus pressure, intermittent headaches, malaise, thick yellow to green nasal discharge and productive cough. He's tried over-the-counter medications without relief.  Past Medical History  Diagnosis Date  . Plantar fasciitis   . Pemphigus   . Diabetes mellitus without complication ([place])   . Hyperlipidemia   . Hypertension    Past Surgical History  Procedure Laterality Date  . Foot surgery Bilateral 2012    Arches    reports that he has never smoked. He has never used smokeless tobacco. He reports that he does not drink alcohol or use illicit drugs. family history includes Diabetes in his other; Hypertension in his other. There is no history of Cancer, Early death, Heart disease, Hyperlipidemia, Kidney disease, Stroke, or Alcohol abuse. Allergies  Allergen Reactions  . Codeine     Codeine- Hallucinations  . Hydrocodone-Acetaminophen     Visual hallucinations & "shakes"     Review of Systems  Constitutional: Positive for fatigue. Negative for fever and chills.  HENT: Positive for congestion and sinus pressure.   Respiratory: Positive for cough. Negative for shortness of breath.   Neurological: Positive for headaches.       Objective:   Physical Exam  Constitutional: He appears well-developed and well-nourished.  HENT:  Right Ear: External ear normal.  Left Ear: External ear normal.  Mouth/Throat: Oropharynx is clear and moist.  Erythematous nasal mucosa bilaterally. Thick yellow crusted drainage left  naris  Neck: Neck supple.  Cardiovascular: Normal rate and regular rhythm.   Pulmonary/Chest: Effort normal and breath sounds normal. No respiratory distress. He has no rales.  Mild wheezes diffusely  Lymphadenopathy:    He has no cervical adenopathy.          Assessment & Plan:  Acute/recurrent versus chronic sinusitis. Prednisone 40 mg daily for 6 days. Omnicef 300 mg twice a day for 10 days. Follow-up with primary in 2 weeks if not improving

## 2015-03-27 ENCOUNTER — Ambulatory Visit: Payer: BC Managed Care – PPO | Admitting: Family

## 2015-05-11 ENCOUNTER — Encounter: Payer: Self-pay | Admitting: Internal Medicine

## 2015-05-11 ENCOUNTER — Ambulatory Visit (INDEPENDENT_AMBULATORY_CARE_PROVIDER_SITE_OTHER): Payer: BC Managed Care – PPO | Admitting: Internal Medicine

## 2015-05-11 ENCOUNTER — Ambulatory Visit (INDEPENDENT_AMBULATORY_CARE_PROVIDER_SITE_OTHER)
Admission: RE | Admit: 2015-05-11 | Discharge: 2015-05-11 | Disposition: A | Discharge status: Home / Self Care | Payer: BC Managed Care – PPO | Source: Ambulatory Visit | Attending: Internal Medicine | Admitting: Internal Medicine

## 2015-05-11 ENCOUNTER — Other Ambulatory Visit (INDEPENDENT_AMBULATORY_CARE_PROVIDER_SITE_OTHER): Payer: BC Managed Care – PPO

## 2015-05-11 VITALS — BP 110/80 | HR 71 | Temp 98.3°F | Resp 16 | Ht 71.0 in | Wt 308.0 lb

## 2015-05-11 DIAGNOSIS — Z0000 Encounter for general adult medical examination without abnormal findings: Secondary | ICD-10-CM | POA: Diagnosis not present

## 2015-05-11 DIAGNOSIS — J309 Allergic rhinitis, unspecified: Secondary | ICD-10-CM | POA: Insufficient documentation

## 2015-05-11 DIAGNOSIS — M170 Bilateral primary osteoarthritis of knee: Secondary | ICD-10-CM

## 2015-05-11 DIAGNOSIS — M25562 Pain in left knee: Secondary | ICD-10-CM

## 2015-05-11 DIAGNOSIS — R7303 Prediabetes: Secondary | ICD-10-CM | POA: Diagnosis not present

## 2015-05-11 DIAGNOSIS — I10 Essential (primary) hypertension: Secondary | ICD-10-CM | POA: Diagnosis not present

## 2015-05-11 DIAGNOSIS — M25561 Pain in right knee: Secondary | ICD-10-CM

## 2015-05-11 LAB — URINALYSIS, ROUTINE W REFLEX MICROSCOPIC
Bilirubin Urine: NEGATIVE
KETONES UR: NEGATIVE
Leukocytes, UA: NEGATIVE
Nitrite: NEGATIVE
PH: 5 (ref 5.0–8.0)
Total Protein, Urine: NEGATIVE
URINE GLUCOSE: NEGATIVE
Urobilinogen, UA: 0.2 (ref 0.0–1.0)
WBC, UA: NONE SEEN (ref 0–?)

## 2015-05-11 LAB — COMPREHENSIVE METABOLIC PANEL
ALK PHOS: 69 U/L (ref 39–117)
ALT: 19 U/L (ref 0–53)
AST: 15 U/L (ref 0–37)
Albumin: 4 g/dL (ref 3.5–5.2)
BILIRUBIN TOTAL: 0.2 mg/dL (ref 0.2–1.2)
BUN: 15 mg/dL (ref 6–23)
CALCIUM: 9.6 mg/dL (ref 8.4–10.5)
CO2: 30 mEq/L (ref 19–32)
Chloride: 103 mEq/L (ref 96–112)
Creatinine, Ser: 0.9 mg/dL (ref 0.40–1.50)
GFR: 119.86 mL/min (ref >60.00)
Glucose, Bld: 77 mg/dL (ref 70–99)
POTASSIUM: 4.3 meq/L (ref 3.5–5.1)
Sodium: 139 mEq/L (ref 135–145)
TOTAL PROTEIN: 7.5 g/dL (ref 6.0–8.3)

## 2015-05-11 LAB — CBC WITH DIFFERENTIAL/PLATELET
BASOS ABS: 0 10*3/uL (ref 0.0–0.1)
Basophils Relative: 0.4 % (ref 0.0–3.0)
Eosinophils Absolute: 0.1 10*3/uL (ref 0.0–0.7)
Eosinophils Relative: 2.1 % (ref 0.0–5.0)
HEMATOCRIT: 42.9 % (ref 39.0–52.0)
HEMOGLOBIN: 13.9 g/dL (ref 13.0–17.0)
LYMPHS PCT: 32.7 % (ref 12.0–46.0)
Lymphs Abs: 1.9 10*3/uL (ref 0.7–4.0)
MCHC: 32.5 g/dL (ref 30.0–36.0)
MCV: 75.6 fl — AB (ref 78.0–100.0)
Monocytes Absolute: 1.1 10*3/uL — ABNORMAL HIGH (ref 0.1–1.0)
Neutro Abs: 2.8 10*3/uL (ref 1.4–7.7)
Neutrophils Relative %: 46.2 % (ref 43.0–77.0)
Platelets: 312 10*3/uL (ref 150.0–400.0)
RBC: 5.67 Mil/uL (ref 4.22–5.81)
RDW: 15.2 % (ref 11.5–15.5)
WBC: 6 10*3/uL (ref 4.0–10.5)

## 2015-05-11 LAB — FECAL OCCULT BLOOD, GUAIAC: Fecal Occult Blood: NEGATIVE

## 2015-05-11 LAB — PSA: PSA: 0.85 ng/mL (ref 0.10–4.00)

## 2015-05-11 LAB — LIPID PANEL
CHOL/HDL RATIO: 3
Cholesterol: 160 mg/dL (ref 0–200)
HDL: 46.2 mg/dL (ref >39.00)
LDL CALC: 97 mg/dL (ref 0–99)
NonHDL: 114.12
Triglycerides: 86 mg/dL (ref 0.0–149.0)
VLDL: 17.2 mg/dL (ref 0.0–40.0)

## 2015-05-11 LAB — HEMOGLOBIN A1C: Hgb A1c MFr Bld: 5.9 % (ref 4.6–6.5)

## 2015-05-11 LAB — TSH: TSH: 1.22 u[IU]/mL (ref 0.35–4.50)

## 2015-05-11 MED ORDER — MOMETASONE FUROATE 50 MCG/ACT NA SUSP: 4.0000 | Freq: Every day | NASAL | Status: DC

## 2015-05-11 MED ORDER — LEVOCETIRIZINE DIHYDROCHLORIDE 5 MG PO TABS: 5.0000 mg | ORAL_TABLET | Freq: Every evening | ORAL | Status: DC

## 2015-05-11 MED ORDER — MELOXICAM 15 MG PO TABS: 15.0000 mg | ORAL_TABLET | Freq: Every day | ORAL | Status: DC

## 2015-05-11 NOTE — Progress Notes (Signed)
Subjective:  Patient ID: Tyler Copeland, male    DOB: [birth year]  Age: 40 y.o. MRN: [id number]  CC: Annual Exam; Allergic Rhinitis ; and Knee Pain   HPI Quinzell Follett presents for a CPX - he complains of worsening bilateral knee pain over the last few months with no hx or trauma or injury. He has not treated the pain. No other joints are painful and he does not complain of joint swelling. He also complains of allergy symptoms with nasal congestion, PND, sneezing and requests a refill on allergy meds.  Outpatient Prescriptions Prior to Visit  Medication Sig Dispense Refill  . triamcinolone ointment (KENALOG) 0.1 %     . cefdinir (OMNICEF) 300 MG capsule Take 1 capsule (300 mg total) by mouth 2 (two) times daily. 20 capsule 0  . levocetirizine (XYZAL) 5 MG tablet Take 1 tablet (5 mg total) by mouth every evening. 90 tablet 3  . mometasone (NASONEX) 50 MCG/ACT nasal spray Place 4 sprays into the nose daily. 17 g 11  . predniSONE (DELTASONE) 20 MG tablet Take 2 tablets daily for 6 days. 12 tablet 0   No facility-administered medications prior to visit.    ROS Review of Systems  Constitutional: Negative.  Negative for fever, chills, diaphoresis, appetite change and fatigue.  HENT: Positive for congestion, postnasal drip, rhinorrhea and sneezing. Negative for facial swelling, sinus pressure, sore throat and trouble swallowing.   Eyes: Negative.   Respiratory: Negative.  Negative for cough, choking, chest tightness, shortness of breath and stridor.   Cardiovascular: Negative.  Negative for chest pain, palpitations and leg swelling.  Gastrointestinal: Negative.  Negative for nausea, vomiting, abdominal pain, diarrhea, constipation and blood in stool.  Endocrine: Negative.   Genitourinary: Negative.  Negative for dysuria, urgency, hematuria, decreased urine volume, difficulty urinating and penile pain.  Musculoskeletal: Positive for arthralgias. Negative for myalgias, back pain, joint swelling  and neck stiffness.  Skin: Negative.  Negative for color change and rash.  Allergic/Immunologic: Negative.   Neurological: Negative.   Hematological: Negative.  Negative for adenopathy. Does not bruise/bleed easily.  Psychiatric/Behavioral: Negative.     Objective:  BP 110/80 mmHg  Pulse 71  Temp(Src) 98.3 F (36.8 C) (Oral)  Resp 16  Ht 5\' 11"  (1.803 m)  Wt 308 lb (139.708 kg)  BMI 42.98 kg/m2  SpO2 98%  BP Readings from Last 3 Encounters:  05/11/15 110/80  03/25/15 130/100  12/24/14 116/80    Wt Readings from Last 3 Encounters:  05/11/15 308 lb (139.708 kg)  03/25/15 300 lb 9.6 oz (136.351 kg)  12/24/14 284 lb (128.822 kg)    Physical Exam  Constitutional: He is oriented to person, place, and time. No distress.  HENT:  Mouth/Throat: Oropharynx is clear and moist. No oropharyngeal exudate.  Eyes: Conjunctivae are normal. Right eye exhibits no discharge. Left eye exhibits no discharge. No scleral icterus.  Neck: Normal range of motion. Neck supple. No JVD present. No tracheal deviation present. No thyromegaly present.  Cardiovascular: Normal rate, regular rhythm, normal heart sounds and intact distal pulses.  Exam reveals no gallop and no friction rub.   No murmur heard. Pulmonary/Chest: Effort normal and breath sounds normal. No stridor. No respiratory distress. He has no wheezes. He has no rales. He exhibits no tenderness.  Abdominal: Soft. Bowel sounds are normal. He exhibits no distension and no mass. There is no tenderness. There is no rebound and no guarding. Hernia confirmed negative in the right inguinal area and confirmed negative in the  left inguinal area.  Genitourinary: Rectum normal, prostate normal, testes normal and penis normal. Rectal exam shows no external hemorrhoid, no internal hemorrhoid, no fissure, no mass, no tenderness and anal tone normal. Guaiac negative stool. Prostate is not enlarged and not tender. Right testis shows no mass, no swelling and no  tenderness. Right testis is descended. Left testis shows no mass, no swelling and no tenderness. Left testis is descended. Circumcised. No penile erythema or penile tenderness. No discharge found.  Musculoskeletal: Normal range of motion. He exhibits no edema or tenderness.       Right knee: Normal. He exhibits normal range of motion, no swelling, no effusion, no deformity, no erythema and normal alignment. No tenderness found.       Left knee: Normal. He exhibits normal range of motion, no swelling, no effusion, no deformity and no erythema. No tenderness found.  Lymphadenopathy:    He has no cervical adenopathy.       Right: No inguinal adenopathy present.       Left: No inguinal adenopathy present.  Neurological: He is oriented to person, place, and time.  Skin: Skin is warm and dry. No rash noted. He is not diaphoretic. No erythema. No pallor.  Psychiatric: He has a normal mood and affect. His behavior is normal. Judgment and thought content normal.  Vitals reviewed.   Lab Results  Component Value Date   WBC 6.0 05/11/2015   HGB 13.9 05/11/2015   HCT 42.9 05/11/2015   PLT 312.0 05/11/2015   GLUCOSE 77 05/11/2015   CHOL 160 05/11/2015   TRIG 86.0 05/11/2015   HDL 46.20 05/11/2015   LDLDIRECT 163.8 07/20/2010   LDLCALC 97 05/11/2015   ALT 19 05/11/2015   AST 15 05/11/2015   NA 139 05/11/2015   K 4.3 05/11/2015   CL 103 05/11/2015   CREATININE 0.90 05/11/2015   BUN 15 05/11/2015   CO2 30 05/11/2015   TSH 1.22 05/11/2015   PSA 0.85 05/11/2015   HGBA1C 5.9 05/11/2015   MICROALBUR 10.1* 02/03/2012    No results found.  Assessment & Plan:   Saran was seen today for annual exam, allergic rhinitis  and knee pain.  Diagnoses and all orders for this visit:  Routine general medical examination at a health care facility- Exam completed, labs ordered and reviewed, vaccines reviewed and updated, patient education material was given. -     Lipid panel; Future -     TSH;  Future -     Urinalysis, Routine w reflex microscopic (not at [facility]); Future -     Comprehensive metabolic panel; Future -     CBC with Differential/Platelet; Future -     PSA; Future  Essential hypertension, benign- his blood pressures well controlled, electrolytes and renal function are stable.  Prediabetes- his A1c is 5.9%, no medications are needed at this time but he will work on his lifestyle modifications -     Hemoglobin A1c; Future  Allergic rhinitis, unspecified allergic rhinitis type- will restart treating this with Xyzal and Nasonex nasal spray. -     levocetirizine (XYZAL) 5 MG tablet; Take 1 tablet (5 mg total) by mouth every evening. -     mometasone (NASONEX) 50 MCG/ACT nasal spray; Place 4 sprays into the nose daily.  Right knee pain- plain films are positive for mild DJD -     DG Knee Complete 4 Views Right; Future -     meloxicam (MOBIC) 15 MG tablet; Take 1 tablet (15 mg total)  by mouth daily.  Left knee pain- plain films are positive for mild DJD -     DG Knee Complete 4 Views Left; Future -     meloxicam (MOBIC) 15 MG tablet; Take 1 tablet (15 mg total) by mouth daily.  Primary osteoarthritis of both knees- will treat with meloxicam -     meloxicam (MOBIC) 15 MG tablet; Take 1 tablet (15 mg total) by mouth daily.   I have discontinued Mr. Ornellas cefdinir and predniSONE. I am also having him start on meloxicam. Additionally, I am having him maintain his triamcinolone ointment, levocetirizine, and mometasone.  Meds ordered this encounter  Medications  . levocetirizine (XYZAL) 5 MG tablet    Sig: Take 1 tablet (5 mg total) by mouth every evening.    Dispense:  90 tablet    Refill:  3  . mometasone (NASONEX) 50 MCG/ACT nasal spray    Sig: Place 4 sprays into the nose daily.    Dispense:  17 g    Refill:  11  . meloxicam (MOBIC) 15 MG tablet    Sig: Take 1 tablet (15 mg total) by mouth daily.    Dispense:  30 tablet    Refill:  5     Follow-up: Return  if symptoms worsen or fail to improve.  Scarlette Calico, MD

## 2015-05-11 NOTE — Patient Instructions (Signed)

## 2015-05-28 ENCOUNTER — Telehealth: Payer: Self-pay | Admitting: Internal Medicine

## 2015-05-28 NOTE — Telephone Encounter (Signed)
Pt has appt to see Dr. Alain Marion tomorrow...Tyler Copeland

## 2015-05-28 NOTE — Telephone Encounter (Signed)
[place] [facility] - Client [facility] Patient Name: Tyler Copeland DOB: [birth year] Initial Comment Caller states has sore throat, sinus, swelling on thumb Nurse Assessment Nurse: Martyn Ehrich RN, Felicia Date/Time (Eastern Time): 05/28/2015 3:27:05 PM Confirm and document reason for call. If symptomatic, describe symptoms. You must click the next button to save text entered. ---Pt has a sore throat onset Mon. No fever. L thumb is swollen - like a knot there onset last week. No known injury - but he is not sure. Has the patient traveled out of the country within the last 30 days? ---No Does the patient have any new or worsening symptoms? ---Yes Will a triage be completed? ---Yes Related visit to physician within the last 2 weeks? ---NoDoes the PT have any chronic conditions? (i.e. diabetes, asthma, etc.) ---No Is this a behavioral health or substance abuse call? ---No Guidelines Guideline Title Affirmed Question Affirmed Notes Sore Throat [1] Sore throat is the only symptom AND [2] present > 48 hours Skin Lesion - Moles or Growths Caller cannot describe it clearly Final Disposition User See PCP When Office is Open (within 3 days) Martyn Ehrich, RN, Felicia Comments No HA Referrals REFERRED TO PCP OFFICE Disagree/Comply: Comply Call Id: [phone]

## 2015-05-29 ENCOUNTER — Ambulatory Visit (INDEPENDENT_AMBULATORY_CARE_PROVIDER_SITE_OTHER): Payer: BC Managed Care – PPO | Admitting: Internal Medicine

## 2015-05-29 ENCOUNTER — Encounter: Payer: Self-pay | Admitting: Internal Medicine

## 2015-05-29 VITALS — BP 110/78 | HR 80 | Temp 98.6°F | Wt 310.0 lb

## 2015-05-29 DIAGNOSIS — M67449 Ganglion, unspecified hand: Secondary | ICD-10-CM | POA: Insufficient documentation

## 2015-05-29 DIAGNOSIS — J0100 Acute maxillary sinusitis, unspecified: Secondary | ICD-10-CM | POA: Diagnosis not present

## 2015-05-29 DIAGNOSIS — L729 Follicular cyst of the skin and subcutaneous tissue, unspecified: Secondary | ICD-10-CM | POA: Diagnosis not present

## 2015-05-29 DIAGNOSIS — J0110 Acute frontal sinusitis, unspecified: Secondary | ICD-10-CM | POA: Insufficient documentation

## 2015-05-29 MED ORDER — AZITHROMYCIN 250 MG PO TABS: ORAL_TABLET | ORAL | Status: DC

## 2015-05-29 NOTE — Assessment & Plan Note (Addendum)
4/17 L thumb 7x7 mm Options discussed: observation vs aspiration vs surgical referral. The cyst has appeared just 1 wk ago: we will watch it for now. F/u w/Dr Ronnald Ramp

## 2015-05-29 NOTE — Assessment & Plan Note (Signed)
Zpac 

## 2015-05-29 NOTE — Patient Instructions (Signed)
Use over-the-counter  "cold" medicines  such as "Tylenol cold" , "Advil cold",  "Mucinex" or" Mucinex D"  for cough and congestion.   Avoid decongestants if you have high blood pressure and use "Afrin" nasal spray for nasal congestion as directed instead. Use" Delsym" or" Robitussin" cough syrup varietis for cough.  You can use plain "Tylenol" or "Advil" for fever, chills and achyness. Use Halls or Ricola cough drops.  Please, make an appointment if you are not better or if you're worse.  

## 2015-05-29 NOTE — Progress Notes (Signed)
Pre visit review using our clinic review tool, if applicable. No additional management support is needed unless otherwise documented below in the visit note. 

## 2015-05-29 NOTE — Progress Notes (Signed)
Subjective:  Patient ID: Tyler Copeland, male    DOB: [birth year]  Age: 40 y.o. MRN: [id number]  CC: No chief complaint on file.   HPI Tyler Copeland presents for a URI sx's x5d - full sinuses, yellow mucus, nasal d/c C/o lump on L thumb x 1 wk -- NT  Outpatient Prescriptions Prior to Visit  Medication Sig Dispense Refill  . levocetirizine (XYZAL) 5 MG tablet Take 1 tablet (5 mg total) by mouth every evening. 90 tablet 3  . meloxicam (MOBIC) 15 MG tablet Take 1 tablet (15 mg total) by mouth daily. 30 tablet 5  . mometasone (NASONEX) 50 MCG/ACT nasal spray Place 4 sprays into the nose daily. 17 g 11  . triamcinolone ointment (KENALOG) 0.1 %      No facility-administered medications prior to visit.    ROS Review of Systems  Constitutional: Negative for appetite change, fatigue and unexpected weight change.  HENT: Positive for congestion and rhinorrhea. Negative for nosebleeds, sneezing, sore throat and trouble swallowing.   Eyes: Negative for itching and visual disturbance.  Respiratory: Negative for cough and wheezing.   Cardiovascular: Negative for chest pain, palpitations and leg swelling.  Gastrointestinal: Negative for nausea, diarrhea, blood in stool and abdominal distention.  Genitourinary: Negative for frequency and hematuria.  Musculoskeletal: Negative for back pain, joint swelling, gait problem and neck pain.  Skin: Negative for rash.  Neurological: Negative for dizziness, tremors, speech difficulty and weakness.  Psychiatric/Behavioral: Negative for sleep disturbance, dysphoric mood and agitation. The patient is not nervous/anxious.     Objective:  BP 110/78 mmHg  Pulse 80  Temp(Src) 98.6 F (37 C) (Oral)  Wt 310 lb (140.615 kg)  SpO2 97%  BP Readings from Last 3 Encounters:  05/29/15 110/78  05/11/15 110/80  03/25/15 130/100    Wt Readings from Last 3 Encounters:  05/29/15 310 lb (140.615 kg)  05/11/15 308 lb (139.708 kg)  03/25/15 300 lb 9.6 oz (136.351  kg)    Physical Exam  Constitutional: He is oriented to person, place, and time. He appears well-developed. No distress.  NAD  HENT:  Mouth/Throat: Oropharynx is clear and moist.  Eyes: Conjunctivae are normal. Pupils are equal, round, and reactive to light.  Neck: Normal range of motion. No JVD present. No thyromegaly present.  Cardiovascular: Normal rate, regular rhythm, normal heart sounds and intact distal pulses.  Exam reveals no gallop and no friction rub.   No murmur heard. Pulmonary/Chest: Effort normal and breath sounds normal. No respiratory distress. He has no wheezes. He has no rales. He exhibits no tenderness.  Abdominal: Soft. Bowel sounds are normal. He exhibits no distension and no mass. There is no tenderness. There is no rebound and no guarding.  Musculoskeletal: Normal range of motion. He exhibits no edema or tenderness.  Lymphadenopathy:    He has no cervical adenopathy.  Neurological: He is alert and oriented to person, place, and time. He has normal reflexes. No cranial nerve deficit. He exhibits normal muscle tone. He displays a negative Romberg sign. Coordination and gait normal.  Skin: Skin is warm and dry. No rash noted.  Psychiatric: He has a normal mood and affect. His behavior is normal. Judgment and thought content normal.  L thumb distal phalanx dorsal mass at the nail base, soft 7x7 mm, NT Obese Swolen nasal mucosa  Lab Results  Component Value Date   WBC 6.0 05/11/2015   HGB 13.9 05/11/2015   HCT 42.9 05/11/2015   PLT 312.0 05/11/2015  GLUCOSE 77 05/11/2015   CHOL 160 05/11/2015   TRIG 86.0 05/11/2015   HDL 46.20 05/11/2015   LDLDIRECT 163.8 07/20/2010   LDLCALC 97 05/11/2015   ALT 19 05/11/2015   AST 15 05/11/2015   NA 139 05/11/2015   K 4.3 05/11/2015   CL 103 05/11/2015   CREATININE 0.90 05/11/2015   BUN 15 05/11/2015   CO2 30 05/11/2015   TSH 1.22 05/11/2015   PSA 0.85 05/11/2015   HGBA1C 5.9 05/11/2015   MICROALBUR 10.1* 02/03/2012     Dg Knee Complete 4 Views Left  05/11/2015  CLINICAL DATA:  Right knee pain/swelling x1 month EXAM: LEFT KNEE - COMPLETE 4+ VIEW COMPARISON:  None. FINDINGS: No fracture or dislocation is seen. Mild tricompartmental degenerative changes. The visualized soft tissues are unremarkable. No suprapatellar knee joint effusion. IMPRESSION: No fracture or dislocation is seen. Mild degenerative changes. Electronically Signed   By: Julian Hy M.D.   On: 05/11/2015 14:50   Dg Knee Complete 4 Views Right  05/11/2015  CLINICAL DATA:  Right knee pain/ swelling x1 month EXAM: RIGHT KNEE - COMPLETE 4+ VIEW COMPARISON:  None. FINDINGS: No fracture or dislocation is seen. Mild tricompartmental degenerative changes. The visualized soft tissues are unremarkable. No suprapatellar knee joint effusion. IMPRESSION: No fracture or dislocation is seen. Mild degenerative changes. Electronically Signed   By: Julian Hy M.D.   On: 05/11/2015 14:50    Assessment & Plan:   There are no diagnoses linked to this encounter. I am having Mr. Vardeman maintain his triamcinolone ointment, levocetirizine, mometasone, meloxicam, and DRYSOL.  Meds ordered this encounter  Medications  . DRYSOL 20 % external solution    Sig: as needed.     Follow-up: No Follow-up on file.  Walker Kehr, MD

## 2015-09-01 ENCOUNTER — Ambulatory Visit (INDEPENDENT_AMBULATORY_CARE_PROVIDER_SITE_OTHER): Payer: BC Managed Care – PPO | Admitting: Internal Medicine

## 2015-09-01 ENCOUNTER — Encounter: Payer: Self-pay | Admitting: Internal Medicine

## 2015-09-01 VITALS — BP 106/82 | HR 75 | Temp 98.1°F | Ht 72.0 in | Wt 303.0 lb

## 2015-09-01 DIAGNOSIS — M67449 Ganglion, unspecified hand: Secondary | ICD-10-CM

## 2015-09-01 DIAGNOSIS — I10 Essential (primary) hypertension: Secondary | ICD-10-CM

## 2015-09-01 DIAGNOSIS — L729 Follicular cyst of the skin and subcutaneous tissue, unspecified: Secondary | ICD-10-CM | POA: Diagnosis not present

## 2015-09-01 DIAGNOSIS — M170 Bilateral primary osteoarthritis of knee: Secondary | ICD-10-CM

## 2015-09-01 MED ORDER — IBUPROFEN 600 MG PO TABS: 600.0000 mg | ORAL_TABLET | Freq: Four times a day (QID) | ORAL | 5 refills | Status: DC | PRN

## 2015-09-01 NOTE — Progress Notes (Signed)
Pre visit review using our clinic review tool, if applicable. No additional management support is needed unless otherwise documented below in the visit note. 

## 2015-09-01 NOTE — Progress Notes (Signed)
Subjective:  Patient ID: Tyler Copeland, male    DOB: 13-Jul-1975  Age: 40 y.o. MRN: [id number]  CC: Cyst (on RT wrist and thumb x 2 months, non-tender)   HPI Tyler Copeland presents for cncerns about a cyst on the dorsum of his right thumb for 3-4 months.  Outpatient Medications Prior to Visit  Medication Sig Dispense Refill  . levocetirizine (XYZAL) 5 MG tablet Take 1 tablet (5 mg total) by mouth every evening. 90 tablet 3  . DRYSOL 20 % external solution as needed.    . mometasone (NASONEX) 50 MCG/ACT nasal spray Place 4 sprays into the nose daily. (Patient not taking: Reported on 09/01/2015) 17 g 11  . triamcinolone ointment (KENALOG) 0.1 %     . azithromycin (ZITHROMAX) 250 MG tablet As directed (Patient not taking: Reported on 09/01/2015) 6 tablet 0  . meloxicam (MOBIC) 15 MG tablet Take 1 tablet (15 mg total) by mouth daily. (Patient not taking: Reported on 09/01/2015) 30 tablet 5   No facility-administered medications prior to visit.     ROS Review of Systems  Constitutional: Negative.  Negative for chills, fatigue and fever.  HENT: Negative.   Eyes: Negative.  Negative for visual disturbance.  Respiratory: Negative for cough, shortness of breath, wheezing and stridor.   Cardiovascular: Negative.  Negative for chest pain, palpitations and leg swelling.  Gastrointestinal: Negative.  Negative for abdominal pain.  Endocrine: Negative.   Genitourinary: Negative.   Musculoskeletal: Positive for arthralgias. Negative for back pain.  Allergic/Immunologic: Negative.   Neurological: Negative.   Hematological: Negative.   Psychiatric/Behavioral: Negative.   All other systems reviewed and are negative.   Objective:  BP 106/82 (BP Location: Left Arm, Patient Position: Sitting, Cuff Size: Large)   Pulse 75   Temp 98.1 F (36.7 C) (Oral)   Ht 6' (1.829 m)   Wt (!) 303 lb (137.4 kg)   SpO2 98%   BMI 41.09 kg/m   BP Readings from Last 3 Encounters:  09/01/15 106/82  05/29/15  110/78  05/11/15 110/80    Wt Readings from Last 3 Encounters:  09/01/15 (!) 303 lb (137.4 kg)  05/29/15 (!) 310 lb (140.6 kg)  05/11/15 (!) 308 lb (139.7 kg)    Physical Exam  Constitutional: He is oriented to person, place, and time. No distress.  HENT:  Mouth/Throat: Oropharynx is clear and moist. No oropharyngeal exudate.  Eyes: Conjunctivae are normal. Right eye exhibits no discharge. Left eye exhibits no discharge. No scleral icterus.  Neck: Normal range of motion. Neck supple. No JVD present. No tracheal deviation present. No thyromegaly present.  Cardiovascular: Normal rate, regular rhythm, normal heart sounds and intact distal pulses.  Exam reveals no gallop and no friction rub.   No murmur heard. Pulmonary/Chest: Effort normal and breath sounds normal. No stridor. No respiratory distress. He has no wheezes. He has no rales. He exhibits no tenderness.  Abdominal: Soft. Bowel sounds are normal. He exhibits no distension and no mass. There is no tenderness. There is no rebound and no guarding.  Musculoskeletal: Normal range of motion. He exhibits no edema, tenderness or deformity.  There is a mucoid cyst noted on the dorsum of his right thumb over the distal phalanx  Lymphadenopathy:    He has no cervical adenopathy.  Neurological: He is oriented to person, place, and time.  Skin: Skin is warm and dry. No rash noted. He is not diaphoretic. No erythema. No pallor.    Lab Results  Component Value Date  WBC 6.0 05/11/2015   HGB 13.9 05/11/2015   HCT 42.9 05/11/2015   PLT 312.0 05/11/2015   GLUCOSE 77 05/11/2015   CHOL 160 05/11/2015   TRIG 86.0 05/11/2015   HDL 46.20 05/11/2015   LDLDIRECT 163.8 07/20/2010   LDLCALC 97 05/11/2015   ALT 19 05/11/2015   AST 15 05/11/2015   NA 139 05/11/2015   K 4.3 05/11/2015   CL 103 05/11/2015   CREATININE 0.90 05/11/2015   BUN 15 05/11/2015   CO2 30 05/11/2015   TSH 1.22 05/11/2015   PSA 0.85 05/11/2015   HGBA1C 5.9 05/11/2015     MICROALBUR 10.1 (H) 02/03/2012    Dg Knee Complete 4 Views Left  Result Date: 05/11/2015 CLINICAL DATA:  Right knee pain/swelling x1 month EXAM: LEFT KNEE - COMPLETE 4+ VIEW COMPARISON:  None. FINDINGS: No fracture or dislocation is seen. Mild tricompartmental degenerative changes. The visualized soft tissues are unremarkable. No suprapatellar knee joint effusion. IMPRESSION: No fracture or dislocation is seen. Mild degenerative changes. Electronically Signed   By: Julian Hy M.D.   On: 05/11/2015 14:50   Dg Knee Complete 4 Views Right  Result Date: 05/11/2015 CLINICAL DATA:  Right knee pain/ swelling x1 month EXAM: RIGHT KNEE - COMPLETE 4+ VIEW COMPARISON:  None. FINDINGS: No fracture or dislocation is seen. Mild tricompartmental degenerative changes. The visualized soft tissues are unremarkable. No suprapatellar knee joint effusion. IMPRESSION: No fracture or dislocation is seen. Mild degenerative changes. Electronically Signed   By: Julian Hy M.D.   On: 05/11/2015 14:50    Assessment & Plan:   Tyler Copeland was seen today for cyst.  Diagnoses and all orders for this visit:  Mucous cyst of finger -     Ambulatory referral to Orthopedic Surgery  Primary osteoarthritis of both knees -     ibuprofen (ADVIL,MOTRIN) 600 MG tablet; Take 1 tablet (600 mg total) by mouth every 6 (six) hours as needed.  Essential hypertension, benign- his blood pressure is well-controlled   I have discontinued Tyler Copeland meloxicam and azithromycin. I am also having him start on ibuprofen. Additionally, I am having him maintain his triamcinolone ointment, levocetirizine, mometasone, and DRYSOL.  Meds ordered this encounter  Medications  . ibuprofen (ADVIL,MOTRIN) 600 MG tablet    Sig: Take 1 tablet (600 mg total) by mouth every 6 (six) hours as needed.    Dispense:  90 tablet    Refill:  5     Follow-up: No Follow-up on file.  Scarlette Calico, MD

## 2015-09-02 NOTE — Patient Instructions (Signed)

## 2016-03-23 ENCOUNTER — Ambulatory Visit: Payer: BC Managed Care – PPO | Admitting: Internal Medicine

## 2016-06-21 ENCOUNTER — Other Ambulatory Visit: Payer: Self-pay | Admitting: Internal Medicine

## 2016-12-02 ENCOUNTER — Ambulatory Visit (INDEPENDENT_AMBULATORY_CARE_PROVIDER_SITE_OTHER): Payer: BC Managed Care – PPO | Admitting: Family Medicine

## 2016-12-02 ENCOUNTER — Encounter: Payer: Self-pay | Admitting: Family Medicine

## 2016-12-02 VITALS — BP 138/74 | HR 99 | Temp 98.6°F | Ht 72.0 in | Wt 303.0 lb

## 2016-12-02 DIAGNOSIS — J0110 Acute frontal sinusitis, unspecified: Secondary | ICD-10-CM

## 2016-12-02 MED ORDER — AMOXICILLIN-POT CLAVULANATE 875-125 MG PO TABS: 1.0000 | ORAL_TABLET | Freq: Two times a day (BID) | ORAL | 0 refills | Status: DC

## 2016-12-02 NOTE — Progress Notes (Signed)
Tyler Copeland - 41 y.o. male MRN [id number]  Date of birth: 1975/12/30  SUBJECTIVE:  Including CC & ROS.  Chief Complaint  Patient presents with  . Cough    started 3 weeks ago-he has some congestion and sinus pressure.     Tyler Copeland is a 41 y.o. male that is Presenting with sinus congestion and cough. He reports his symptoms have been occurring for over a month. The cough is nonproductive. He seems to have coughing worse at night. He has a history of asthma and has been compliant with his medications. He has tried several over-the-counter medications with limited improvement. He has a Pharmacist, hospital at [employer]. He may have had some sick contacts.   Review of Systems  Constitutional: Negative for fever.  HENT: Positive for rhinorrhea and sinus pain.   Respiratory: Positive for cough. Negative for shortness of breath.   Cardiovascular: Negative for chest pain.    HISTORY: Past Medical, Surgical, Social, and Family History Reviewed & Updated per EMR.   Pertinent Historical Findings include:  Past Medical History:  Diagnosis Date  . Diabetes mellitus without complication ([place])   . Hyperlipidemia   . Hypertension   . Pemphigus   . Plantar fasciitis     Past Surgical History:  Procedure Laterality Date  . FOOT SURGERY Bilateral 2012   Arches    Allergies  Allergen Reactions  . Codeine     Codeine- Hallucinations  . Hydrocodone-Acetaminophen     Visual hallucinations & "shakes"    Family History  Problem Relation Age of Onset  . Diabetes Other   . Hypertension Other   . Cancer Neg Hx   . Early death Neg Hx   . Heart disease Neg Hx   . Hyperlipidemia Neg Hx   . Kidney disease Neg Hx   . Stroke Neg Hx   . Alcohol abuse Neg Hx      Social History   Social History  . Marital status: Married    Spouse name: Joelene Millin  . Number of children: N/A  . Years of education: BS   Occupational History  . High School teacher and coach    Social History Main Topics    . Smoking status: Never Smoker  . Smokeless tobacco: Never Used  . Alcohol use No  . Drug use: No  . Sexual activity: Not Currently   Other Topics Concern  . Not on file   Social History Narrative   Regular exercise-No   Caffeine Herbal tea, some 2 cups  occasionall of caffeine.        PHYSICAL EXAM:  VS: BP 138/74 (BP Location: Left Arm, Patient Position: Sitting, Cuff Size: Large)   Pulse 99   Temp 98.6 F (37 C) (Oral)   Ht 6' (1.829 m)   Wt (!) 303 lb (137.4 kg)   SpO2 99%   BMI 41.09 kg/m  Physical Exam Gen: NAD, alert, cooperative with exam, well-appearing ENT: normal lips, normal nasal mucosa, tympanic membranes clear and intact bilaterally, normal oropharynx, no cervical lymphadenopathy, frontal and maxillary sinus tenderness to palpation Eye: normal EOM, normal conjunctiva and lids CV:  no edema, +2 pedal pulses, S1-S2, regular rate and rhythm   Resp: no accessory muscle use, non-labored,  clear to auscultation bilaterally, no crackles or wheezes Skin: no rashes, no areas of induration  Neuro: normal tone, normal sensation to touch Psych:  normal insight, alert and oriented MSK: Normal gait, normal strength      ASSESSMENT & PLAN:  Acute non-recurrent frontal sinusitis Symptoms were present for about a month now. - Use Augmentin - Counseled on supportive care - If no improvement consider a chest x-ray versus treatment for reflux if the cough continues

## 2016-12-02 NOTE — Patient Instructions (Signed)
Thank you for coming in,   Please try things such as zyrtec-D or allegra-D which is an antihistamine and decongestant.   Please try afrin which will help with nasal congestion but use for only three days.   Please also try using a netti pot on a regular occasion.  Honey can help with a sore throat.   Please try a probiotic with the antibiotic.     Please feel free to call with any questions or concerns at any time, at [phone]. --Dr. Raeford Razor

## 2016-12-02 NOTE — Assessment & Plan Note (Signed)
Symptoms were present for about a month now. - Use Augmentin - Counseled on supportive care - If no improvement consider a chest x-ray versus treatment for reflux if the cough continues

## 2016-12-21 ENCOUNTER — Ambulatory Visit (INDEPENDENT_AMBULATORY_CARE_PROVIDER_SITE_OTHER): Payer: BC Managed Care – PPO | Admitting: Family Medicine

## 2016-12-21 ENCOUNTER — Encounter: Payer: Self-pay | Admitting: *Deleted

## 2016-12-21 ENCOUNTER — Encounter: Payer: Self-pay | Admitting: Family Medicine

## 2016-12-21 ENCOUNTER — Ambulatory Visit (INDEPENDENT_AMBULATORY_CARE_PROVIDER_SITE_OTHER)
Admission: RE | Admit: 2016-12-21 | Discharge: 2016-12-21 | Disposition: A | Discharge status: Home / Self Care | Payer: BC Managed Care – PPO | Source: Ambulatory Visit | Attending: Family Medicine | Admitting: Family Medicine

## 2016-12-21 VITALS — BP 130/82 | HR 74 | Temp 98.0°F | Resp 16 | Ht 72.0 in | Wt 289.0 lb

## 2016-12-21 DIAGNOSIS — R0989 Other specified symptoms and signs involving the circulatory and respiratory systems: Secondary | ICD-10-CM | POA: Diagnosis not present

## 2016-12-21 DIAGNOSIS — J310 Chronic rhinitis: Secondary | ICD-10-CM

## 2016-12-21 DIAGNOSIS — R05 Cough: Secondary | ICD-10-CM

## 2016-12-21 DIAGNOSIS — J989 Respiratory disorder, unspecified: Secondary | ICD-10-CM

## 2016-12-21 DIAGNOSIS — R053 Chronic cough: Secondary | ICD-10-CM

## 2016-12-21 MED ORDER — BENZONATATE 100 MG PO CAPS: 200.0000 mg | ORAL_CAPSULE | Freq: Two times a day (BID) | ORAL | 0 refills | Status: AC | PRN

## 2016-12-21 MED ORDER — METHYLPREDNISOLONE ACETATE 40 MG/ML IJ SUSP
40.0000 mg | Freq: Once | INTRAMUSCULAR | Status: AC
Start: 2016-12-21 — End: 2016-12-21
  Administered 2016-12-21: 40 mg via INTRAMUSCULAR

## 2016-12-21 MED ORDER — PREDNISONE 20 MG PO TABS
40.0000 mg | ORAL_TABLET | Freq: Every day | ORAL | 0 refills | Status: AC
Start: 2016-12-21 — End: 2016-12-24

## 2016-12-21 MED ORDER — ALBUTEROL SULFATE HFA 108 (90 BASE) MCG/ACT IN AERS: 2.0000 | INHALATION_SPRAY | Freq: Four times a day (QID) | RESPIRATORY_TRACT | 0 refills | Status: DC | PRN

## 2016-12-21 NOTE — Progress Notes (Signed)
ACUTE VISIT  HPI:  Chief Complaint  Patient presents with  . Nasal Congestion  . Cough    [name] is a 41 y.o.male here today with his wife complaining of 2 months of respiratory symptoms. He is requesting "allergy shot", which has helped with symptoms in the past.  Symptoms have been worse for the past 3 weeks.  He completed treatment with Augmentin but did not notice improvement.  Productive cough with yellowish sputum, worse in the morning. + Cough spells. He has not identified exacerbating or alleviating factors.   Cough  This is a recurrent problem. The current episode started more than 1 month ago. The problem has been gradually worsening. The problem occurs every few hours. The cough is productive of sputum. Associated symptoms include nasal congestion, postnasal drip, rhinorrhea and wheezing. Pertinent negatives include no chest pain, chills, ear congestion, ear pain, eye redness, fever, headaches, heartburn, hemoptysis, myalgias, rash, sore throat, shortness of breath or sweats. The symptoms are aggravated by lying down. His past medical history is significant for environmental allergies.  His wife reports wheezing at night. He denies dyspnea.  Vaccines up to date.  No Hx of recent travel. No sick contact. No known insect bite.  Hx of allergies: Yes, he is not on intranasal steroid because does not like it.  OTC medications for this problem: Tylenol cold,Benadryl,hot teat.    Review of Systems  Constitutional: Positive for fatigue. Negative for activity change, appetite change, chills and fever.  HENT: Positive for congestion, postnasal drip and rhinorrhea. Negative for ear pain, mouth sores, sneezing, sore throat, trouble swallowing and voice change.   Eyes: Negative for discharge, redness and itching.  Respiratory: Positive for cough and wheezing. Negative for hemoptysis, chest tightness and shortness of breath.   Cardiovascular: Negative  for chest pain and leg swelling.  Gastrointestinal: Negative for abdominal pain, diarrhea, heartburn, nausea and vomiting.  Musculoskeletal: Negative for back pain, myalgias and neck pain.  Skin: Negative for rash.  Allergic/Immunologic: Positive for environmental allergies.  Neurological: Negative for weakness and headaches.  Hematological: Negative for adenopathy. Does not bruise/bleed easily.  Psychiatric/Behavioral: Positive for sleep disturbance. Negative for confusion. The patient is nervous/anxious.       Current Outpatient Medications on File Prior to Visit  Medication Sig Dispense Refill  . cetirizine (ZYRTEC) 10 MG tablet TAKE 1 TABLET (10 MG TOTAL) BY MOUTH DAILY. 30 tablet 10  . ibuprofen (ADVIL,MOTRIN) 600 MG tablet Take 1 tablet (600 mg total) by mouth every 6 (six) hours as needed. 90 tablet 5   No current facility-administered medications on file prior to visit.      Past Medical History:  Diagnosis Date  . Diabetes mellitus without complication ([place])   . Hyperlipidemia   . Hypertension   . Pemphigus   . Plantar fasciitis    Allergies  Allergen Reactions  . Codeine     Codeine- Hallucinations  . Hydrocodone-Acetaminophen     Visual hallucinations & "shakes"    Social History   Socioeconomic History  . Marital status: Married    Spouse name: Joelene Millin  . Number of children: None  . Years of education: BS  . Highest education level: None  Social Needs  . Financial resource strain: None  . Food insecurity - worry: None  . Food insecurity - inability: None  . Transportation needs - medical: None  . Transportation needs - non-medical: None  Occupational History  . Occupation: Public relations account executive and  coach  Tobacco Use  . Smoking status: Never Smoker  . Smokeless tobacco: Never Used  Substance and Sexual Activity  . Alcohol use: No  . Drug use: No  . Sexual activity: Not Currently  Other Topics Concern  . None  Social History Narrative   Regular  exercise-No   Caffeine Herbal tea, some 2 cups  occasionall of caffeine.    Vitals:   12/21/16 0936  BP: 130/82  Pulse: 74  Resp: 16  Temp: 98 F (36.7 C)  SpO2: 99%   Body mass index is 39.2 kg/m.   Physical Exam  Nursing note and vitals reviewed. Constitutional: He is oriented to person, place, and time. He appears well-developed. He does not appear ill. No distress.  HENT:  Head: Normocephalic and atraumatic.  Right Ear: Tympanic membrane normal.  Nose: Rhinorrhea present. Right sinus exhibits no maxillary sinus tenderness and no frontal sinus tenderness. Left sinus exhibits no maxillary sinus tenderness and no frontal sinus tenderness.  Mouth/Throat: Oropharynx is clear and moist and mucous membranes are normal.  Hypertrophic turbinates. Nasal voice. Postnasal drainage.  Eyes: Conjunctivae are normal. Pupils are equal, round, and reactive to light.  Cardiovascular: Normal rate and regular rhythm.  No murmur heard. Respiratory: Effort normal and breath sounds normal. No stridor. No respiratory distress.  Musculoskeletal: He exhibits no edema.  Lymphadenopathy:       Head (right side): No submandibular adenopathy present.       Head (left side): No submandibular adenopathy present.    He has no cervical adenopathy.  Neurological: He is alert and oriented to person, place, and time. He has normal strength. Gait normal.  Skin: Skin is warm. No rash noted. No erythema.  Psychiatric: His mood appears anxious.  Well groomed, good eye contact.     ASSESSMENT AND PLAN:   Mr. Tyler Copeland was seen today for nasal congestion and cough.  Diagnoses and all orders for this visit:  Reactive airway disease that is not asthma  I do not appreciate wheezing or other abnormality on auscultation. He is requesting same "allergy shot" he has received in the past , reviewing records, he received DepoMedrol in 2016. After verbal consent and discussion of some side effects he received Depo  Medrol 40 mg IM. Tomorrow he can continue with prednisone 40 mg x 3 days. Side effects of med discussed. Albuterol inh 2 puff every 6 hours for a week then as needed for wheezing or shortness of breath.  F/U with PCP in 2 weeks, before if needed.  -     albuterol (PROVENTIL HFA;VENTOLIN HFA) 108 (90 Base) MCG/ACT inhaler; Inhale 2 puffs into the lungs every 6 (six) hours as needed for wheezing or shortness of breath. -     predniSONE (DELTASONE) 20 MG tablet; Take 2 tablets (40 mg total) by mouth daily with breakfast for 3 days.  Cough, persistent  Possible causes discussed: allergies,GERD among some. CXR ordered today, further recommendations will be given according to results.   -     DG Chest 2 View; Future -     benzonatate (TESSALON) 100 MG capsule; Take 2 capsules (200 mg total) by mouth 2 (two) times daily as needed for up to 10 days for cough. -     methylPREDNISolone acetate (DEPO-MEDROL) injection 40 mg  Chronic rhinitis  OTC antihistaminic recommended, Allegra or Zyrtec (he took before). Nasal irrigations with saline. Not interested in intranasal steroids.    -Mr. Tyler Copeland was advised to seek attention immediately  if symptoms worsen.        Tyler Copeland G. Martinique, MD  [facility]. [place] [facility].

## 2016-12-21 NOTE — Patient Instructions (Signed)
  [name] I have seen you today for an acute visit.  A few things to remember from today's visit:   Chronic rhinitis  Cough, persistent - Plan: DG Chest 2 View, benzonatate (TESSALON) 100 MG capsule  Reactive airway disease that is not asthma - Plan: albuterol (PROVENTIL HFA;VENTOLIN HFA) 108 (90 Base) MCG/ACT inhaler, predniSONE (DELTASONE) 20 MG tablet   Medications prescribed today are intended for short period of time and will not be refill upon request, a follow up appointment might be necessary to discuss continuation of of treatment if appropriate.  Albuterol inh 2 puff every 6 hours for a week then as needed for wheezing or shortness of breath.     In general please monitor for signs of worsening symptoms and seek immediate medical attention if any concerning.    I hope you get better soon!

## 2017-02-27 ENCOUNTER — Other Ambulatory Visit: Payer: BC Managed Care – PPO

## 2017-02-27 ENCOUNTER — Ambulatory Visit: Payer: BC Managed Care – PPO | Admitting: Internal Medicine

## 2017-02-27 ENCOUNTER — Encounter: Payer: Self-pay | Admitting: Internal Medicine

## 2017-02-27 VITALS — BP 120/70 | HR 68 | Temp 98.1°F | Resp 16 | Ht 73.0 in | Wt 302.0 lb

## 2017-02-27 DIAGNOSIS — L03012 Cellulitis of left finger: Secondary | ICD-10-CM

## 2017-02-27 MED ORDER — SULFAMETHOXAZOLE-TRIMETHOPRIM 800-160 MG PO TABS: 1.0000 | ORAL_TABLET | Freq: Two times a day (BID) | ORAL | 0 refills | Status: AC

## 2017-02-27 NOTE — Patient Instructions (Signed)
Paronychia Paronychia is an infection of the skin that surrounds a nail. It usually affects the skin around a fingernail, but it may also occur near a toenail. It often causes pain and swelling around the nail. This condition may come on suddenly or develop over a longer period. In some cases, a collection of pus (abscess) can form near or under the nail. Usually, paronychia is not serious and it clears up with treatment. What are the causes? This condition may be caused by bacteria or fungi. It is commonly caused by either Streptococcus or Staphylococcus bacteria. The bacteria or fungi often cause the infection by getting into the affected area through an opening in the skin, such as a cut or a hangnail. What increases the risk? This condition is more likely to develop in:  People who get their hands wet often, such as those who work as Designer, industrial/product, bartenders, or nurses.  People who bite their fingernails or suck their thumbs.  People who trim their nails too short.  People who have hangnails or injured fingertips.  People who get manicures.  People who have diabetes.  What are the signs or symptoms? Symptoms of this condition include:  Redness and swelling of the skin near the nail.  Tenderness around the nail when you touch the area.  Pus-filled bumps under the cuticle. The cuticle is the skin at the base or sides of the nail.  Fluid or pus under the nail.  Throbbing pain in the area.  How is this diagnosed? This condition is usually diagnosed with a physical exam. In some cases, a sample of pus may be taken from an abscess to be tested in a lab. This can help to determine what type of bacteria or fungi is causing the condition. How is this treated? Treatment for this condition depends on the cause and severity of the condition. If the condition is mild, it may clear up on its own in a few days. Your health care provider may recommend soaking the affected area in warm water a  few times a day. When treatment is needed, the options may include:  Antibiotic medicine, if the condition is caused by a bacterial infection.  Antifungal medicine, if the condition is caused by a fungal infection.  Incision and drainage, if an abscess is present. In this procedure, the health care provider will cut open the abscess so the pus can drain out.  Follow these instructions at home:  Soak the affected area in warm water if directed to do so by your health care provider. You may be told to do this for 20 minutes, 2-3 times a day. Keep the area dry in between soakings.  Take medicines only as directed by your health care provider.  If you were prescribed an antibiotic medicine, finish all of it even if you start to feel better.  Keep the affected area clean.  Do not try to drain a fluid-filled bump yourself.  If you will be washing dishes or performing other tasks that require your hands to get wet, wear rubber gloves. You should also wear gloves if your hands might come in contact with irritating substances, such as cleaners or chemicals.  Follow your health care provider's instructions about: ? Wound care. ? Bandage (dressing) changes and removal. Contact a health care provider if:  Your symptoms get worse or do not improve with treatment.  You have a fever or chills.  You have redness spreading from the affected area.  You have continued  or increased fluid, blood, or pus coming from the affected area.  Your finger or knuckle becomes swollen or is difficult to move. This information is not intended to replace advice given to you by your health care provider. Make sure you discuss any questions you have with your health care provider. Document Released: 07/13/2000 Document Revised: 06/25/2015 Document Reviewed: 12/25/2013 Elsevier Interactive Patient Education  2018 Elsevier Inc.  

## 2017-02-27 NOTE — Progress Notes (Signed)
Subjective:  Patient ID: Tyler Copeland, male    DOB: 14-Nov-1975  Age: 42 y.o. MRN: [id number]  CC: Hand Pain   HPI Tyler Copeland presents for a 4-day history of pain, redness and swelling on the dorsum of his left ring finger.  He denies any trauma or injury.  Outpatient Medications Prior to Visit  Medication Sig Dispense Refill  . albuterol (PROVENTIL HFA;VENTOLIN HFA) 108 (90 Base) MCG/ACT inhaler Inhale 2 puffs into the lungs every 6 (six) hours as needed for wheezing or shortness of breath. 1 Inhaler 0  . cetirizine (ZYRTEC) 10 MG tablet TAKE 1 TABLET (10 MG TOTAL) BY MOUTH DAILY. 30 tablet 10  . ibuprofen (ADVIL,MOTRIN) 600 MG tablet Take 1 tablet (600 mg total) by mouth every 6 (six) hours as needed. 90 tablet 5   No facility-administered medications prior to visit.     ROS Review of Systems  Constitutional: Negative for chills and fever.  HENT: Negative.   Eyes: Negative.   Respiratory: Negative.   Cardiovascular: Negative.   Gastrointestinal: Negative.   Genitourinary: Negative.   Musculoskeletal: Negative.  Negative for arthralgias.  Hematological: Negative.  Negative for adenopathy. Does not bruise/bleed easily.    Objective:  BP 120/70 (BP Location: Left Arm, Patient Position: Sitting, Cuff Size: Large)   Pulse 68   Temp 98.1 F (36.7 C) (Oral)   Resp 16   Ht 6\' 1"  (1.854 m)   Wt (!) 302 lb 0.6 oz (137 kg)   SpO2 98%   BMI 39.85 kg/m   BP Readings from Last 3 Encounters:  02/27/17 120/70  12/21/16 130/82  12/02/16 138/74    Wt Readings from Last 3 Encounters:  02/27/17 (!) 302 lb 0.6 oz (137 kg)  12/21/16 289 lb (131.1 kg)  12/02/16 (!) 303 lb (137.4 kg)    Physical Exam  Musculoskeletal:       Hands: The area was cleaned with Betadine and prepped and draped in sterile fashion.  Local anesthesia was obtained with the direct installation of 0.5% plain Marcaine.  0.5 cc were used.  An 11 blade was used to make an incision in the paronychia and a  significant amount of pus was easily extracted.  There was no significant bleeding.  He tolerated this well.  Dressing has been applied.  He continues to be neurovascularly intact in the finger.    Lab Results  Component Value Date   WBC 6.0 05/11/2015   HGB 13.9 05/11/2015   HCT 42.9 05/11/2015   PLT 312.0 05/11/2015   GLUCOSE 77 05/11/2015   CHOL 160 05/11/2015   TRIG 86.0 05/11/2015   HDL 46.20 05/11/2015   LDLDIRECT 163.8 07/20/2010   LDLCALC 97 05/11/2015   ALT 19 05/11/2015   AST 15 05/11/2015   NA 139 05/11/2015   K 4.3 05/11/2015   CL 103 05/11/2015   CREATININE 0.90 05/11/2015   BUN 15 05/11/2015   CO2 30 05/11/2015   TSH 1.22 05/11/2015   PSA 0.85 05/11/2015   HGBA1C 5.9 05/11/2015   MICROALBUR 10.1 (H) 02/03/2012    Dg Chest 2 View  Result Date: 12/21/2016 CLINICAL DATA:  Two months of cough with increasing severity over the past 3 weeks associated with head congestion. Nonsmoker. History of diabetes, sinusitis, hypertension EXAM: CHEST  2 VIEW COMPARISON:  None in PACs FINDINGS: The lungs are adequately inflated and clear. The heart and pulmonary vascularity are normal. The mediastinum is normal in width. There is no pleural effusion. The bony thorax  exhibits no acute abnormality. IMPRESSION: There is no acute cardiopulmonary abnormality. Electronically Signed   By: David  Martinique M.D.   On: 12/21/2016 16:12    Assessment & Plan:   Tyler Copeland was seen today for hand pain.  Diagnoses and all orders for this visit:  Paronychia of left ring finger- I and D was completed, a culture of the pus is pending.  For now, will empirically treat for staph with Bactrim. -     WOUND CULTURE; Future -     sulfamethoxazole-trimethoprim (BACTRIM DS,SEPTRA DS) 800-160 MG tablet; Take 1 tablet by mouth 2 (two) times daily for 7 days.   I am having Tyler Copeland start on sulfamethoxazole-trimethoprim. I am also having him maintain his ibuprofen, cetirizine, and albuterol.  Meds  ordered this encounter  Medications  . sulfamethoxazole-trimethoprim (BACTRIM DS,SEPTRA DS) 800-160 MG tablet    Sig: Take 1 tablet by mouth 2 (two) times daily for 7 days.    Dispense:  14 tablet    Refill:  0     Follow-up: Return in about 3 days (around 03/02/2017).  Scarlette Calico, MD

## 2017-03-02 LAB — WOUND CULTURE
MICRO NUMBER: 90115948
SPECIMEN QUALITY:: ADEQUATE

## 2017-09-14 ENCOUNTER — Encounter: Payer: BC Managed Care – PPO | Admitting: Internal Medicine

## 2017-10-24 ENCOUNTER — Encounter: Payer: BC Managed Care – PPO | Admitting: Internal Medicine

## 2017-10-24 DIAGNOSIS — Z0289 Encounter for other administrative examinations: Secondary | ICD-10-CM

## 2018-09-03 ENCOUNTER — Other Ambulatory Visit: Payer: Self-pay | Admitting: Orthopaedic Surgery

## 2018-09-03 DIAGNOSIS — M19071 Primary osteoarthritis, right ankle and foot: Secondary | ICD-10-CM

## 2018-09-03 DIAGNOSIS — M19072 Primary osteoarthritis, left ankle and foot: Secondary | ICD-10-CM

## 2018-09-11 ENCOUNTER — Other Ambulatory Visit: Payer: BC Managed Care – PPO

## 2018-09-20 ENCOUNTER — Other Ambulatory Visit: Payer: BC Managed Care – PPO

## 2018-09-28 ENCOUNTER — Other Ambulatory Visit: Payer: BC Managed Care – PPO

## 2018-10-05 ENCOUNTER — Ambulatory Visit
Admission: RE | Admit: 2018-10-05 | Discharge: 2018-10-05 | Disposition: A | Discharge status: Home / Self Care | Payer: BC Managed Care – PPO | Source: Ambulatory Visit | Attending: Orthopaedic Surgery | Admitting: Orthopaedic Surgery

## 2018-10-05 ENCOUNTER — Other Ambulatory Visit: Payer: Self-pay

## 2018-10-05 DIAGNOSIS — M19072 Primary osteoarthritis, left ankle and foot: Secondary | ICD-10-CM

## 2018-10-05 DIAGNOSIS — M19071 Primary osteoarthritis, right ankle and foot: Secondary | ICD-10-CM

## 2018-10-19 ENCOUNTER — Telehealth: Payer: Self-pay

## 2018-10-19 NOTE — Telephone Encounter (Signed)
Spoke with pt, he had called 10/16/18 to ask for a letter for his employer stating that he needs to be able to work remotely from home due to being the primary caregiver for his mother-in-law, [place] Adams MRN: [id number], due to her medical conditions.   Dorothyann Peng, NP  You      He will need to contact his PCP. I see his mother in law but cannot write a note for him       This message was sent to [facility], as her PCP, and not to Dr. Ronnald Ramp. Explained to pt that Tommi Rumps is not able to complete this for him. Message routed to Dr. Ronnald Ramp office.

## 2018-10-22 NOTE — Telephone Encounter (Signed)
Letter is printed. Pt would like to pick up letter.

## 2018-10-22 NOTE — Telephone Encounter (Signed)
Are you okay with witting a letter for pt to work from home?

## 2018-10-22 NOTE — Telephone Encounter (Signed)
Yes, I am ok with this  TJ

## 2018-11-08 ENCOUNTER — Encounter: Payer: BC Managed Care – PPO | Admitting: Internal Medicine

## 2018-11-28 ENCOUNTER — Encounter: Payer: BC Managed Care – PPO | Admitting: Internal Medicine

## 2018-12-18 ENCOUNTER — Encounter: Payer: BC Managed Care – PPO | Admitting: Internal Medicine

## 2019-01-08 ENCOUNTER — Encounter: Payer: BC Managed Care – PPO | Admitting: Internal Medicine

## 2019-01-29 ENCOUNTER — Encounter: Payer: BC Managed Care – PPO | Admitting: Internal Medicine

## 2019-02-27 ENCOUNTER — Encounter: Payer: BC Managed Care – PPO | Admitting: Internal Medicine

## 2019-03-20 ENCOUNTER — Encounter: Payer: BC Managed Care – PPO | Admitting: Internal Medicine

## 2019-04-10 ENCOUNTER — Encounter: Payer: BC Managed Care – PPO | Admitting: Internal Medicine

## 2019-05-22 ENCOUNTER — Encounter: Payer: BC Managed Care – PPO | Admitting: Internal Medicine

## 2019-07-30 ENCOUNTER — Encounter: Payer: BC Managed Care – PPO | Admitting: Internal Medicine

## 2019-08-19 ENCOUNTER — Encounter: Payer: BC Managed Care – PPO | Admitting: Internal Medicine

## 2019-09-16 ENCOUNTER — Encounter: Payer: BC Managed Care – PPO | Admitting: Internal Medicine

## 2020-04-07 ENCOUNTER — Telehealth: Payer: Self-pay | Admitting: Internal Medicine

## 2020-04-07 NOTE — Telephone Encounter (Signed)
LVM for pt to schedule appt 

## 2020-04-07 NOTE — Telephone Encounter (Signed)
Ok with me 

## 2020-04-07 NOTE — Telephone Encounter (Signed)
Patient is requesting to re establish care. Please advise

## 2020-04-28 ENCOUNTER — Encounter: Payer: BC Managed Care – PPO | Admitting: Internal Medicine

## 2020-05-05 ENCOUNTER — Encounter: Payer: Self-pay | Admitting: Internal Medicine

## 2020-06-01 ENCOUNTER — Encounter: Payer: Self-pay | Admitting: Internal Medicine

## 2020-07-13 ENCOUNTER — Encounter: Payer: Self-pay | Admitting: Internal Medicine

## 2020-08-06 LAB — TSH: TSH: 0.96 (ref 0.41–5.90)

## 2020-08-06 LAB — PSA: PSA: 0.7

## 2020-08-25 ENCOUNTER — Encounter: Payer: Self-pay | Admitting: Internal Medicine

## 2020-08-25 ENCOUNTER — Other Ambulatory Visit: Payer: Self-pay

## 2020-08-25 ENCOUNTER — Ambulatory Visit (INDEPENDENT_AMBULATORY_CARE_PROVIDER_SITE_OTHER): Payer: BC Managed Care – PPO | Admitting: Internal Medicine

## 2020-08-25 VITALS — BP 136/88 | HR 82 | Temp 97.9°F | Resp 16 | Ht 73.0 in | Wt 314.0 lb

## 2020-08-25 DIAGNOSIS — E559 Vitamin D deficiency, unspecified: Secondary | ICD-10-CM

## 2020-08-25 DIAGNOSIS — I10 Essential (primary) hypertension: Secondary | ICD-10-CM

## 2020-08-25 DIAGNOSIS — R7303 Prediabetes: Secondary | ICD-10-CM | POA: Diagnosis not present

## 2020-08-25 DIAGNOSIS — J301 Allergic rhinitis due to pollen: Secondary | ICD-10-CM

## 2020-08-25 DIAGNOSIS — R0683 Snoring: Secondary | ICD-10-CM

## 2020-08-25 DIAGNOSIS — Z1211 Encounter for screening for malignant neoplasm of colon: Secondary | ICD-10-CM | POA: Insufficient documentation

## 2020-08-25 DIAGNOSIS — Z23 Encounter for immunization: Secondary | ICD-10-CM

## 2020-08-25 DIAGNOSIS — Z0000 Encounter for general adult medical examination without abnormal findings: Secondary | ICD-10-CM | POA: Diagnosis not present

## 2020-08-25 LAB — HEPATIC FUNCTION PANEL
ALT: 32 U/L (ref 0–53)
AST: 25 U/L (ref 0–37)
Albumin: 4.1 g/dL (ref 3.5–5.2)
Alkaline Phosphatase: 74 U/L (ref 39–117)
Bilirubin, Direct: 0.1 mg/dL (ref 0.0–0.3)
Total Bilirubin: 0.3 mg/dL (ref 0.2–1.2)
Total Protein: 7.7 g/dL (ref 6.0–8.3)

## 2020-08-25 LAB — BASIC METABOLIC PANEL
BUN: 16 mg/dL (ref 6–23)
CO2: 30 mEq/L (ref 19–32)
Calcium: 9.6 mg/dL (ref 8.4–10.5)
Chloride: 103 mEq/L (ref 96–112)
Creatinine, Ser: 0.85 mg/dL (ref 0.40–1.50)
GFR: 104.69 mL/min (ref >60.00)
Glucose, Bld: 75 mg/dL (ref 70–99)
Potassium: 4 mEq/L (ref 3.5–5.1)
Sodium: 139 mEq/L (ref 135–145)

## 2020-08-25 LAB — LIPID PANEL
Cholesterol: 156 mg/dL (ref 0–200)
HDL: 44.7 mg/dL (ref >39.00)
LDL Cholesterol: 92 mg/dL (ref 0–99)
NonHDL: 111.16
Total CHOL/HDL Ratio: 3
Triglycerides: 96 mg/dL (ref 0.0–149.0)
VLDL: 19.2 mg/dL (ref 0.0–40.0)

## 2020-08-25 LAB — VITAMIN D 25 HYDROXY (VIT D DEFICIENCY, FRACTURES): VITD: 24.01 ng/mL — ABNORMAL LOW (ref 30.00–100.00)

## 2020-08-25 LAB — CBC WITH DIFFERENTIAL/PLATELET
Basophils Absolute: 0 10*3/uL (ref 0.0–0.1)
Basophils Relative: 0.4 % (ref 0.0–3.0)
Eosinophils Absolute: 0.1 10*3/uL (ref 0.0–0.7)
Eosinophils Relative: 1.8 % (ref 0.0–5.0)
HCT: 40.3 % (ref 39.0–52.0)
Hemoglobin: 12.7 g/dL — ABNORMAL LOW (ref 13.0–17.0)
Lymphocytes Relative: 33.2 % (ref 12.0–46.0)
Lymphs Abs: 1.6 10*3/uL (ref 0.7–4.0)
MCHC: 31.5 g/dL (ref 30.0–36.0)
MCV: 76.3 fl — ABNORMAL LOW (ref 78.0–100.0)
Monocytes Absolute: 0.7 10*3/uL (ref 0.1–1.0)
Monocytes Relative: 15.7 % — ABNORMAL HIGH (ref 3.0–12.0)
Neutro Abs: 2.3 10*3/uL (ref 1.4–7.7)
Neutrophils Relative %: 48.9 % (ref 43.0–77.0)
Platelets: 267 10*3/uL (ref 150.0–400.0)
RBC: 5.28 Mil/uL (ref 4.22–5.81)
RDW: 15.6 % — ABNORMAL HIGH (ref 11.5–15.5)
WBC: 4.7 10*3/uL (ref 4.0–10.5)

## 2020-08-25 LAB — URINALYSIS, ROUTINE W REFLEX MICROSCOPIC
Bilirubin Urine: NEGATIVE
Ketones, ur: NEGATIVE
Leukocytes,Ua: NEGATIVE
Nitrite: NEGATIVE
Specific Gravity, Urine: 1.025 (ref 1.000–1.030)
Total Protein, Urine: NEGATIVE
Urine Glucose: NEGATIVE
Urobilinogen, UA: 0.2 (ref 0.0–1.0)
pH: 6 (ref 5.0–8.0)

## 2020-08-25 LAB — HEMOGLOBIN A1C: Hgb A1c MFr Bld: 5.8 % (ref 4.6–6.5)

## 2020-08-25 NOTE — Progress Notes (Signed)
Alergic rhin  Heavy snoring  Subjective:  Patient ID: Tyler Copeland, male    DOB: March 06, 1975  Age: 45 y.o. MRN: [id number]  CC: Annual Exam and Hypertension  This visit occurred during the SARS-CoV-2 public health emergency.  Safety protocols were in place, including screening questions prior to the visit, additional usage of staff PPE, and extensive cleaning of exam room while observing appropriate contact time as indicated for disinfecting solutions.    HPI Tyler Copeland presents for a CPX and f/up -   He was recently seen at a [facility] is being treated for hypothyroidism and hypogonadism.  He saw urologist about 4 months ago and is being treated for BPH.  He has heavy snoring.  He is active and denies any recent episodes of chest pain, shortness of breath, palpitations, or edema.  Outpatient Medications Prior to Visit  Medication Sig Dispense Refill   alfuzosin (UROXATRAL) 10 MG 24 hr tablet Take 10 mg by mouth daily with breakfast.     cetirizine (ZYRTEC) 10 MG tablet TAKE 1 TABLET (10 MG TOTAL) BY MOUTH DAILY. 30 tablet 10   ibuprofen (ADVIL,MOTRIN) 600 MG tablet Take 1 tablet (600 mg total) by mouth every 6 (six) hours as needed. 90 tablet 5   albuterol (PROVENTIL HFA;VENTOLIN HFA) 108 (90 Base) MCG/ACT inhaler Inhale 2 puffs into the lungs every 6 (six) hours as needed for wheezing or shortness of breath. 1 Inhaler 0   No facility-administered medications prior to visit.    ROS Review of Systems  Constitutional:  Positive for fatigue and unexpected weight change (wt gain). Negative for appetite change, chills and diaphoresis.  HENT:  Positive for congestion and postnasal drip. Negative for rhinorrhea, sinus pressure, sore throat and trouble swallowing.   Eyes: Negative.   Respiratory:  Negative for cough, chest tightness, shortness of breath and wheezing.   Cardiovascular:  Negative for chest pain, palpitations and leg swelling.  Gastrointestinal:  Negative for abdominal pain,  diarrhea and nausea.  Endocrine: Negative.   Genitourinary:  Negative for difficulty urinating, dysuria and hematuria.  Musculoskeletal: Negative.  Negative for arthralgias and myalgias.  Skin: Negative.   Neurological: Negative.  Negative for dizziness, weakness, light-headedness and headaches.  Hematological:  Negative for adenopathy. Does not bruise/bleed easily.  Psychiatric/Behavioral: Negative.     Objective:  BP 136/88 (BP Location: Left Arm, Patient Position: Sitting, Cuff Size: Large)   Pulse 82   Temp 97.9 F (36.6 C) (Oral)   Resp 16   Ht '6\' 1"'$  (1.854 m)   Wt (!) 314 lb (142.4 kg)   SpO2 99%   BMI 41.43 kg/m   BP Readings from Last 3 Encounters:  08/25/20 136/88  02/27/17 120/70  12/21/16 130/82    Wt Readings from Last 3 Encounters:  08/25/20 (!) 314 lb (142.4 kg)  02/27/17 (!) 302 lb 0.6 oz (137 kg)  12/21/16 289 lb (131.1 kg)    Physical Exam Vitals reviewed.  Constitutional:      Appearance: He is obese.  HENT:     Nose: Nose normal.     Mouth/Throat:     Mouth: Mucous membranes are moist.  Eyes:     General: No scleral icterus.    Conjunctiva/sclera: Conjunctivae normal.  Cardiovascular:     Rate and Rhythm: Normal rate and regular rhythm.     Heart sounds: No murmur heard.    Comments: EKG- NSR, 76 bpm No LVH Normal EKG Pulmonary:     Effort: Pulmonary effort is normal.  Breath sounds: No stridor. No wheezing, rhonchi or rales.  Abdominal:     General: Abdomen is protuberant. Bowel sounds are normal. There is no distension.     Palpations: Abdomen is soft. There is no fluid wave, hepatomegaly, splenomegaly or mass.     Tenderness: There is no abdominal tenderness.  Musculoskeletal:     Cervical back: Neck supple.     Right lower leg: No edema.     Left lower leg: No edema.  Skin:    General: Skin is warm and dry.     Coloration: Skin is not pale.  Neurological:     General: No focal deficit present.     Mental Status: Mental  status is at baseline.  Psychiatric:        Mood and Affect: Mood normal.        Behavior: Behavior normal.    Lab Results  Component Value Date   WBC 4.7 08/25/2020   HGB 12.7 (L) 08/25/2020   HCT 40.3 08/25/2020   PLT 267.0 08/25/2020   GLUCOSE 75 08/25/2020   CHOL 156 08/25/2020   TRIG 96.0 08/25/2020   HDL 44.70 08/25/2020   LDLDIRECT 163.8 07/20/2010   LDLCALC 92 08/25/2020   ALT 32 08/25/2020   AST 25 08/25/2020   NA 139 08/25/2020   K 4.0 08/25/2020   CL 103 08/25/2020   CREATININE 0.85 08/25/2020   BUN 16 08/25/2020   CO2 30 08/25/2020   TSH 0.96 08/06/2020   PSA 0.7 08/06/2020   HGBA1C 5.8 08/25/2020   MICROALBUR 10.1 (H) 02/03/2012    CT FOOT LEFT WO CONTRAST  Result Date: 10/06/2018 CLINICAL DATA:  Chronic left foot pain EXAM: CT OF THE LEFT FOOT WITHOUT CONTRAST TECHNIQUE: Multidetector CT imaging of the left foot was performed according to the standard protocol. Multiplanar CT image reconstructions were also generated. COMPARISON:  X-ray 08/06/2018, MRI 08/10/2018 FINDINGS: Bones/Joint/Cartilage No acute fracture. No malalignment. Tibiotalar joint space is maintained. Minimal degenerative spurring within the inferior aspects of the medial and lateral gutters. Osseous manifestations of subfibular impingement. No talar dome osteochondral lesion is evident. Mild anterior osteophytosis of the tibiotalar joint. Moderate to severe degenerative changes involving the anterior and middle subtalar joints. Mild degenerative spurring along the posterior aspect of the posterior subtalar joint. There is a prominent os trigonum. Small distal Achilles enthesophyte. Minimal degenerative changes at the calcaneocuboid joint. Marked dorsal hypertrophy and irregularity of the talonavicular joint. Surgical implant again noted within the sinus tarsi. Multiple small ossific fragments adjacent to the anterior process of the calcaneus, which may be sequela of prior avulsion injury. Small os  peroneum and os navicular. Tarsometatarsal joint alignment is maintained. Minimal joint space narrowing across the TMT joints. Small well ossified body at the dorsal aspect of the second TMT joint. Metatarsophalangeal and interphalangeal joint spaces are relatively preserved. No appreciable tibiotalar or subtalar joint effusion. Ligaments Suboptimally assessed by CT. Muscles and Tendons No significant muscular atrophy or fatty infiltration. Mild fluid adjacent to the flexor hallucis longus at the level of the myotendinous junction. Posterior tibialis tendon appears somewhat thickened and ill-defined as it traverses the medial malleolus. The remaining visualized tendons appear grossly intact. Soft tissues No additional soft tissue abnormality. IMPRESSION: 1. Moderate degenerative changes of the left hindfoot and midfoot, as detailed above. 2. Probable posterior tibialis tendinosis. 3. Small amount of fluid adjacent to the FHL mild tendinous junction, which may reflect a component of tenosynovitis. Electronically Signed   By: Marisue Brooklyn.D.  On: 10/06/2018 09:35   CT FOOT RIGHT WO CONTRAST  Result Date: 10/06/2018 CLINICAL DATA:  Chronic foot pain EXAM: CT OF THE RIGHT FOOT WITHOUT CONTRAST TECHNIQUE: Multidetector CT imaging of the right foot was performed according to the standard protocol. Multiplanar CT image reconstructions were also generated. COMPARISON:  MRI 08/10/2018.  X-ray 08/06/2018 FINDINGS: Bones/Joint/Cartilage No acute fracture. No malalignment. Tibiotalar joint space is maintained. Minimal degenerative spurring within the inferior aspects of the medial and lateral gutters. No talar dome osteochondral lesion is evident. Mild anterior osteophytosis of the tibiotalar joint. Mild degenerative changes involving the anterior and middle subtalar joints. The posterior subtalar joint space is maintained. There is a prominent os trigonum. Small bidirectional calcaneal enthesophytes. Minimal  degenerative changes at the calcaneocuboid and talonavicular joints within the midfoot. Mild talonavicular dorsal hypertrophy. Surgical implant again noted within the sinus tarsi. 1.3 x 0.6 cm ossification adjacent to the hardware along its anterolateral margin, which may reflect sequela of remote trauma. Small os peroneum. Tarsometatarsal joint alignment is maintained. Minimal joint space narrowing across the TMT joints. Small well ossified body at the dorsal aspect of the second TMT joint. Metatarsophalangeal and interphalangeal joint spaces are relatively preserved. No tibiotalar or subtalar joint effusion. Ligaments Suboptimally assessed by CT. Muscles and Tendons No muscular atrophy or fatty infiltration is evident. Thickened, ill-defined appearance of the posterior tibialis tendon with fluid distending the tendon sheath (series 4, image 36). The remaining visualized tendons appear within normal limits. Soft tissues Remaining soft tissues are within normal limits. IMPRESSION: 1. Mild degenerative changes of the right hindfoot and midfoot, as detailed above. 2. Posterior tibialis tendinosis with tenosynovitis. Electronically Signed   By: Davina Poke M.D.   On: 10/06/2018 09:28    Assessment & Plan:   Tyler Copeland was seen today for annual exam and hypertension.  Diagnoses and all orders for this visit:  Essential hypertension, benign- He has not achieved his blood pressure goal of 130/80.  His labs are negative for secondary causes or endorgan damage.  His EKG is reassuring.  He would prefer not to start an antihypertensive and agrees to improve his lifestyle modifications. -     CBC with Differential/Platelet; Future -     Basic metabolic panel; Future -     Urinalysis, Routine w reflex microscopic; Future -     Cancel: Thyroid Panel With TSH; Future -     Hepatic function panel; Future -     VITAMIN D 25 Hydroxy (Vit-D Deficiency, Fractures); Future -     EKG 12-Lead -     VITAMIN D 25 Hydroxy  (Vit-D Deficiency, Fractures) -     Hepatic function panel -     Urinalysis, Routine w reflex microscopic -     Basic metabolic panel -     CBC with Differential/Platelet  Prediabetes- Improvement noted. -     Basic metabolic panel; Future -     Hemoglobin A1c; Future -     Hemoglobin A1c -     Basic metabolic panel  Routine general medical examination at a health care facility- Exam completed, labs reviewed, vaccines reviewed and updated, cancer screenings addressed, patient education was given. -     Lipid panel; Future -     Cancel: PSA; Future -     Lipid panel  Severe obesity (BMI 35.0-39.9) with comorbidity ([place])- Labs are negative for secondary causes or complications. -     Cancel: Thyroid Panel With TSH; Future -     Hepatic  function panel; Future -     Hepatic function panel  Loud snoring -     Ambulatory referral to Sleep Studies  Screen for colon cancer -     Ambulatory referral to Gastroenterology  Need for vaccination -     Tdap vaccine greater than or equal to 7yo IM  Non-seasonal allergic rhinitis due to pollen -     Ambulatory referral to Allergy  Vitamin D deficiency -     Cholecalciferol 50 MCG (2000 UT) TABS; Take 1 tablet (2,000 Units total) by mouth daily.  I have discontinued Tyler Copeland's ibuprofen. I am also having him start on Cholecalciferol. Additionally, I am having him maintain his cetirizine, albuterol, and alfuzosin.  Meds ordered this encounter  Medications   Cholecalciferol 50 MCG (2000 UT) TABS    Sig: Take 1 tablet (2,000 Units total) by mouth daily.    Dispense:  90 tablet    Refill:  1      Follow-up: Return in about 6 months (around 02/25/2021).  Scarlette Calico, MD

## 2020-08-25 NOTE — Patient Instructions (Signed)
Health Maintenance, Male Adopting a healthy lifestyle and getting preventive care are important in promoting health and wellness. Ask your health care provider about: The right schedule for you to have regular tests and exams. Things you can do on your own to prevent diseases and keep yourself healthy. What should I know about diet, weight, and exercise? Eat a healthy diet  Eat a diet that includes plenty of vegetables, fruits, low-fat dairy products, and lean protein. Do not eat a lot of foods that are high in solid fats, added sugars, or sodium.  Maintain a healthy weight Body mass index (BMI) is a measurement that can be used to identify possible weight problems. It estimates body fat based on height and weight. Your health care provider can help determine your BMI and help you achieve or maintain ahealthy weight. Get regular exercise Get regular exercise. This is one of the most important things you can do for your health. Most adults should: Exercise for at least 150 minutes each week. The exercise should increase your heart rate and make you sweat (moderate-intensity exercise). Do strengthening exercises at least twice a week. This is in addition to the moderate-intensity exercise. Spend less time sitting. Even light physical activity can be beneficial. Watch cholesterol and blood lipids Have your blood tested for lipids and cholesterol at 46 years of age, then havethis test every 5 years. You may need to have your cholesterol levels checked more often if: Your lipid or cholesterol levels are high. You are older than 46 years of age. You are at high risk for heart disease. What should I know about cancer screening? Many types of cancers can be detected early and may often be prevented. Depending on your health history and family history, you may need to have cancer screening at various ages. This may include screening for: Colorectal cancer. Prostate cancer. Skin cancer. Lung  cancer. What should I know about heart disease, diabetes, and high blood pressure? Blood pressure and heart disease High blood pressure causes heart disease and increases the risk of stroke. This is more likely to develop in people who have high blood pressure readings, are of African descent, or are overweight. Talk with your health care provider about your target blood pressure readings. Have your blood pressure checked: Every 3-5 years if you are 18-39 years of age. Every year if you are 40 years old or older. If you are between the ages of 65 and 75 and are a current or former smoker, ask your health care provider if you should have a one-time screening for abdominal aortic aneurysm (AAA). Diabetes Have regular diabetes screenings. This checks your fasting blood sugar level. Have the screening done: Once every three years after age 45 if you are at a normal weight and have a low risk for diabetes. More often and at a younger age if you are overweight or have a high risk for diabetes. What should I know about preventing infection? Hepatitis B If you have a higher risk for hepatitis B, you should be screened for this virus. Talk with your health care provider to find out if you are at risk forhepatitis B infection. Hepatitis C Blood testing is recommended for: Everyone born from 1945 through 1965. Anyone with known risk factors for hepatitis C. Sexually transmitted infections (STIs) You should be screened each year for STIs, including gonorrhea and chlamydia, if: You are sexually active and are younger than 46 years of age. You are older than 46 years of age   and your health care provider tells you that you are at risk for this type of infection. Your sexual activity has changed since you were last screened, and you are at increased risk for chlamydia or gonorrhea. Ask your health care provider if you are at risk. Ask your health care provider about whether you are at high risk for HIV.  Your health care provider may recommend a prescription medicine to help prevent HIV infection. If you choose to take medicine to prevent HIV, you should first get tested for HIV. You should then be tested every 3 months for as long as you are taking the medicine. Follow these instructions at home: Lifestyle Do not use any products that contain nicotine or tobacco, such as cigarettes, e-cigarettes, and chewing tobacco. If you need help quitting, ask your health care provider. Do not use street drugs. Do not share needles. Ask your health care provider for help if you need support or information about quitting drugs. Alcohol use Do not drink alcohol if your health care provider tells you not to drink. If you drink alcohol: Limit how much you have to 0-2 drinks a day. Be aware of how much alcohol is in your drink. In the [place] one drink equals one 12 oz bottle of beer (355 mL), one 5 oz glass of wine (148 mL), or one 1 oz glass of hard liquor (44 mL). General instructions Schedule regular health, dental, and eye exams. Stay current with your vaccines. Tell your health care provider if: You often feel depressed. You have ever been abused or do not feel safe at home. Summary Adopting a healthy lifestyle and getting preventive care are important in promoting health and wellness. Follow your health care provider's instructions about healthy diet, exercising, and getting tested or screened for diseases. Follow your health care provider's instructions on monitoring your cholesterol and blood pressure. This information is not intended to replace advice given to you by your health care provider. Make sure you discuss any questions you have with your healthcare provider. Document Revised: 01/10/2018 Document Reviewed: 01/10/2018 Elsevier Patient Education  2022 Else[employer] Inc.  

## 2020-08-26 ENCOUNTER — Encounter: Payer: Self-pay | Admitting: Internal Medicine

## 2020-08-26 DIAGNOSIS — E559 Vitamin D deficiency, unspecified: Secondary | ICD-10-CM | POA: Insufficient documentation

## 2020-08-26 DIAGNOSIS — Z23 Encounter for immunization: Secondary | ICD-10-CM | POA: Insufficient documentation

## 2020-08-26 MED ORDER — CHOLECALCIFEROL 50 MCG (2000 UT) PO TABS: 1.0000 | ORAL_TABLET | Freq: Every day | ORAL | 1 refills | Status: AC

## 2020-09-18 ENCOUNTER — Ambulatory Visit: Payer: BC Managed Care – PPO | Admitting: Internal Medicine

## 2020-09-18 NOTE — Progress Notes (Deleted)
NEW PATIENT Date of Service/Encounter:  09/18/20 Referring provider: Janith Lima, MD Primary care provider: Janith Lima, MD  Subjective:  No chief complaint on file.  Tyler Copeland is a 45 y.o. male with a PMHx of hypercholesterolemia, hypertension, sleep apnea, osteoarthritis, vitamin D deficiency presenting today for evaluation of chronic rhinitis. History obtained from: chart review and {Persons; PED relatives w/patient:19415::"patient"}.   *** Other allergy screening: Asthma: {Blank single:19197::"yes","no"} Rhino conjunctivitis: {Blank single:19197::"yes","no"} Food allergy: {Blank single:19197::"yes","no"} Medication allergy: {Blank single:19197::"yes","no"} Hymenoptera allergy: {Blank single:19197::"yes","no"} Urticaria: {Blank single:19197::"yes","no"} Eczema:{Blank single:19197::"yes","no"} History of recurrent infections suggestive of immunodeficency: {Blank single:19197::"yes","no"} ***Vaccinations are up to date.   Past Medical History: Past Medical History:  Diagnosis Date   Diabetes mellitus without complication ([place])    Hyperlipidemia    Hypertension    Pemphigus    Plantar fasciitis    Medication List:  Current Outpatient Medications  Medication Sig Dispense Refill   albuterol (PROVENTIL HFA;VENTOLIN HFA) 108 (90 Base) MCG/ACT inhaler Inhale 2 puffs into the lungs every 6 (six) hours as needed for wheezing or shortness of breath. 1 Inhaler 0   alfuzosin (UROXATRAL) 10 MG 24 hr tablet Take 10 mg by mouth daily with breakfast.     cetirizine (ZYRTEC) 10 MG tablet TAKE 1 TABLET (10 MG TOTAL) BY MOUTH DAILY. 30 tablet 10   Cholecalciferol 50 MCG (2000 UT) TABS Take 1 tablet (2,000 Units total) by mouth daily. 90 tablet 1   No current facility-administered medications for this visit.   Known Allergies:  Allergies  Allergen Reactions   Codeine     Codeine- Hallucinations   Hydrocodone-Acetaminophen     Visual hallucinations & "shakes"   Past  Surgical History: Past Surgical History:  Procedure Laterality Date   FOOT SURGERY Bilateral 2012   Arches   Family History: Family History  Problem Relation Age of Onset   Diabetes Other    Hypertension Other    Cancer Neg Hx    Early death Neg Hx    Heart disease Neg Hx    Hyperlipidemia Neg Hx    Kidney disease Neg Hx    Stroke Neg Hx    Alcohol abuse Neg Hx    Social History: Tyler Copeland lives ***.   ROS:  All other systems negative except as noted per HPI.  Objective:  There were no vitals taken for this visit. There is no height or weight on file to calculate BMI. Physical Exam:  General Appearance:  Alert, cooperative, no distress, appears stated age  Head:  Normocephalic, without obvious abnormality, atraumatic  Eyes:  Conjunctiva clear, EOM's intact  Nose: Nares normal  Throat: Lips, tongue normal; teeth and gums normal  Neck: Supple, symmetrical  Lungs:   Respirations unlabored, no coughing  Heart:  Appears well perfused  Extremities: No edema  Skin: Skin color, texture, turgor normal, no rashes or lesions on visualized portions of skin  Neurologic: No gross deficits   Diagnostics: Spirometry:  Tracings reviewed. His effort: {Blank single:19197::"Good reproducible efforts.","It was hard to get consistent efforts and there is a question as to whether this reflects a maximal maneuver.","Poor effort, data can not be interpreted."} FVC: ***L FEV1: ***L, ***% predicted FEV1/FVC ratio: ***% Interpretation: {Blank single:19197::"Spirometry consistent with mild obstructive disease","Spirometry consistent with moderate obstructive disease","Spirometry consistent with severe obstructive disease","Spirometry consistent with possible restrictive disease","Spirometry consistent with mixed obstructive and restrictive disease","Spirometry uninterpretable due to technique","Spirometry consistent with normal pattern","No overt abnormalities noted given today's efforts"}.  Please  see scanned spirometry  results for details.  Skin Testing: {Blank single:19197::"Select foods","Environmental allergy panel","Environmental allergy panel and select foods","Food allergy panel","None","Deferred due to recent antihistamines use"}. Positive test to: ***. Negative test to: ***.  Results discussed with patient/family.   {Blank single:19197::"Allergy testing results were read and interpreted by myself, documented by clinical staff."," "}  Assessment:  No diagnosis found. Plan/Recommendations:  There are no Patient Instructions on file for this visit.  No follow-ups on file.  {Blank single:19197::"This note in its entirety was forwarded to the Provider who requested this consultation."}  Thank you for your kind referral. I appreciate the opportunity to take part in Abrahim's care. Please do not hesitate to contact me with questions.***  Sincerely,  Sigurd Sos, MD [facility]

## 2020-10-19 ENCOUNTER — Ambulatory Visit: Payer: BC Managed Care – PPO | Admitting: Internal Medicine

## 2020-11-09 ENCOUNTER — Institutional Professional Consult (permissible substitution): Payer: BC Managed Care – PPO | Admitting: Neurology

## 2020-11-09 NOTE — Progress Notes (Deleted)
NEW PATIENT Date of Service/Encounter:  11/09/20 Referring provider: Janith Lima, MD Primary care provider: Janith Lima, MD  Subjective:  Tyler Copeland is a 45 y.o. male with a PMHx of hypertension, sleep apnea, obesity, prediabetes, vitamin D deficiency presenting today for evaluation of allergic rhinitis History obtained from: chart review and {Persons; PED relatives w/patient:19415::"patient"}.   *** Other allergy screening: Asthma: {Blank single:19197::"yes","no"} Rhino conjunctivitis: {Blank single:19197::"yes","no"} Food allergy: {Blank single:19197::"yes","no"} Medication allergy: {Blank single:19197::"yes","no"} Hymenoptera allergy: {Blank single:19197::"yes","no"} Urticaria: {Blank single:19197::"yes","no"} Eczema:{Blank single:19197::"yes","no"} History of recurrent infections suggestive of immunodeficency: {Blank single:19197::"yes","no"} ***Vaccinations are up to date.   Past Medical History: Past Medical History:  Diagnosis Date   Diabetes mellitus without complication ([place])    Hyperlipidemia    Hypertension    Pemphigus    Plantar fasciitis    Medication List:  Current Outpatient Medications  Medication Sig Dispense Refill   albuterol (PROVENTIL HFA;VENTOLIN HFA) 108 (90 Base) MCG/ACT inhaler Inhale 2 puffs into the lungs every 6 (six) hours as needed for wheezing or shortness of breath. 1 Inhaler 0   alfuzosin (UROXATRAL) 10 MG 24 hr tablet Take 10 mg by mouth daily with breakfast.     cetirizine (ZYRTEC) 10 MG tablet TAKE 1 TABLET (10 MG TOTAL) BY MOUTH DAILY. 30 tablet 10   Cholecalciferol 50 MCG (2000 UT) TABS Take 1 tablet (2,000 Units total) by mouth daily. 90 tablet 1   No current facility-administered medications for this visit.   Known Allergies:  Allergies  Allergen Reactions   Codeine     Codeine- Hallucinations   Hydrocodone-Acetaminophen     Visual hallucinations & "shakes"   Past Surgical History: Past Surgical History:   Procedure Laterality Date   FOOT SURGERY Bilateral 2012   Arches   Family History: Family History  Problem Relation Age of Onset   Diabetes Other    Hypertension Other    Cancer Neg Hx    Early death Neg Hx    Heart disease Neg Hx    Hyperlipidemia Neg Hx    Kidney disease Neg Hx    Stroke Neg Hx    Alcohol abuse Neg Hx    Social History: Tyler Copeland lives ***.   ROS:  All other systems negative except as noted per HPI.  Objective:  There were no vitals taken for this visit. There is no height or weight on file to calculate BMI. Physical Exam:  General Appearance:  Alert, cooperative, no distress, appears stated age  Head:  Normocephalic, without obvious abnormality, atraumatic  Eyes:  Conjunctiva clear, EOM's intact  Nose: Nares normal  Throat: Lips, tongue normal; teeth and gums normal  Neck: Supple, symmetrical  Lungs:   Respirations unlabored, no coughing  Heart:  Appears well perfused  Extremities: No edema  Skin: Skin color, texture, turgor normal, no rashes or lesions on visualized portions of skin  Neurologic: No gross deficits   Diagnostics: Spirometry:  Tracings reviewed. His effort: {Blank single:19197::"Good reproducible efforts.","It was hard to get consistent efforts and there is a question as to whether this reflects a maximal maneuver.","Poor effort, data can not be interpreted."} FVC: ***L FEV1: ***L, ***% predicted FEV1/FVC ratio: ***% Interpretation: {Blank single:19197::"Spirometry consistent with mild obstructive disease","Spirometry consistent with moderate obstructive disease","Spirometry consistent with severe obstructive disease","Spirometry consistent with possible restrictive disease","Spirometry consistent with mixed obstructive and restrictive disease","Spirometry uninterpretable due to technique","Spirometry consistent with normal pattern","No overt abnormalities noted given today's efforts"}.  Please see scanned spirometry results for  details.  Skin Testing: {  Blank QASTMH:96222::"LNLGXQ foods","Environmental allergy panel","Environmental allergy panel and select foods","Food allergy panel","None","Deferred due to recent antihistamines use"}. Positive test to: ***. Negative test to: ***.  Results discussed with patient/family.   {Blank single:19197::"Allergy testing results were read and interpreted by myself, documented by clinical staff."," "}  Assessment:  No diagnosis found. Plan/Recommendations:  There are no Patient Instructions on file for this visit.  No follow-ups on file.  {Blank single:19197::"This note in its entirety was forwarded to the Provider who requested this consultation."}  Thank you for your kind referral. I appreciate the opportunity to take part in Tyler Copeland's care. Please do not hesitate to contact me with questions.***  Sincerely,  Tyler Sos, MD [facility]

## 2020-11-11 ENCOUNTER — Ambulatory Visit: Payer: BC Managed Care – PPO | Admitting: Internal Medicine

## 2020-11-25 ENCOUNTER — Encounter: Payer: Self-pay | Admitting: Internal Medicine

## 2020-12-08 ENCOUNTER — Ambulatory Visit: Payer: BC Managed Care – PPO | Admitting: Family Medicine

## 2021-01-12 ENCOUNTER — Encounter: Payer: Self-pay | Admitting: Internal Medicine

## 2021-01-12 ENCOUNTER — Other Ambulatory Visit: Payer: Self-pay

## 2021-01-12 ENCOUNTER — Ambulatory Visit (AMBULATORY_SURGERY_CENTER): Payer: BC Managed Care – PPO | Admitting: *Deleted

## 2021-01-12 VITALS — Ht 73.0 in | Wt 315.0 lb

## 2021-01-12 DIAGNOSIS — Z1211 Encounter for screening for malignant neoplasm of colon: Secondary | ICD-10-CM

## 2021-01-12 MED ORDER — NA SULFATE-K SULFATE-MG SULF 17.5-3.13-1.6 GM/177ML PO SOLN
1.0000 | Freq: Once | ORAL | 0 refills | Status: AC
Start: 2021-01-12 — End: 2021-01-12

## 2021-01-12 NOTE — Progress Notes (Signed)
PV completed over the phone. Pt verified name, DOB, address and insurance during PV today.  Pt mailed instruction packet with copy of consent form to read and not return, and instructions. - emailed instructions to [email or web address]  Pt encouraged to call with questions or issues.  If pt has My chart, procedure instructions sent via My Chart   No egg or soy allergy known to patient  No issues known to pt with past sedation with any surgeries or procedures Patient denies ever being told they had issues or difficulty with intubation  No FH of Malignant Hyperthermia Pt is not on diet pills Pt is not on  home 02  Pt is not on blood thinners  Pt denies issues with constipation  No A fib or A flutter  Pt is fully vaccinated  for Covid   NO PA's for preps discussed with pt In PV today  Discussed with pt there will be an out-of-pocket cost for prep and that varies from $0 to 70 +  dollars - pt verbalized understanding   Due to the COVID-19 pandemic we are asking patients to follow certain guidelines in PV and the [facility]   Pt aware of COVID protocols and [facility] guidelines

## 2021-01-13 NOTE — Progress Notes (Signed)
NEW PATIENT Date of Service/Encounter:  01/14/21 Referring provider: Janith Lima, MD Primary care provider: Janith Lima, MD  Subjective:  Tyler Copeland is a 45 y.o. male with a PMHx of chronic rhinitis, hypertension, sleep apnea, obesity, prediabetes, vitamin D deficiency presenting today for evaluation of chronic rhinitis History obtained from: chart review and patient.   Chronic rhinitis: started as an adult Symptoms include: nasal congestion, rhinorrhea, post nasal drainage, and sneezing  Occurs year-round with seasonal flares Treatments tried: Cetirizine, he doesn't like nasal sprays.  Never been on Singulair.  Takes Benadryl as needed. Previous allergy testing: no History of reflux/heartburn:  no, he avoids drinking juice before he goes to bed  Additionally, he was given an albuterol inhaler-however he is never used it. He has never been diagnosed with asthma.  He is not a smoker. He does wheeze but he thinks it's because of his drainage. He does have a cough that is worse at night, when he does exercise, and after he eats.  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  He wonders if certain foods could be flaring his rash.  He tolerates eggs, wheat, shellfish, fish, dairy, soy, peanuts, and tree nuts. However, he would like to have food allergy testing.  Food allergy: no  Past Medical History: Past Medical History:  Diagnosis Date   Allergy    Diabetes mellitus without complication ([place])    no meds   Hyperlipidemia    Hypertension    Pemphigus    Plantar fasciitis    Medication List:  Current Outpatient Medications  Medication Sig Dispense Refill   albuterol (VENTOLIN HFA) 108 (90 Base) MCG/ACT inhaler Inhale 2 puffs into the lungs every 6 (six) hours as needed for wheezing or shortness of breath. 8 g 2   alfuzosin (UROXATRAL) 10 MG 24 hr tablet Take 10 mg by mouth daily with breakfast.     ARMOUR THYROID 30 MG tablet Take  30 mg by mouth daily.     budesonide-formoterol (SYMBICORT) 80-4.5 MCG/ACT inhaler Inhale 2 puffs into the lungs in the morning and at bedtime. 1 each 12   cetirizine (ZYRTEC) 10 MG tablet TAKE 1 TABLET (10 MG TOTAL) BY MOUTH DAILY. 30 tablet 10   Cyanocobalamin (VITAMIN B-12) 1000 MCG/15ML LIQD Take by mouth.     guaiFENesin (MUCINEX) 600 MG 12 hr tablet Take 1 tablet (600 mg total) by mouth 2 (two) times daily as needed (Make sure you drink plenty of water.). 60 tablet 5   levocetirizine (XYZAL) 5 MG tablet Take 1 tablet (5 mg total) by mouth 2 (two) times daily as needed for allergies (Can take an extra dose during flare ups.). 60 tablet 5   Multiple Vitamin (MULTIVITAMIN) tablet Take 1 tablet by mouth daily.     triamcinolone ointment (KENALOG) 0.1 % Apply topically as directed.     Cholecalciferol 50 MCG (2000 UT) TABS Take 1 tablet (2,000 Units total) by mouth daily. (Patient not taking: Reported on 01/12/2021) 90 tablet 1   No current facility-administered medications for this visit.   Known Allergies:  Allergies  Allergen Reactions   Codeine     Codeine- Hallucinations   Hydrocodone-Acetaminophen     Visual hallucinations & "shakes"- Vicodin    Past Surgical History: Past Surgical History:  Procedure Laterality Date   FINGER SURGERY     pinkie finger surgery to repair tendon   FOOT SURGERY Bilateral 01/31/2010   Arches   MOUTH SURGERY  N/A 2015   Family History: Family History  Problem Relation Age of Onset   Diabetes Other    Hypertension Other    Cancer Neg Hx    Early death Neg Hx    Heart disease Neg Hx    Hyperlipidemia Neg Hx    Kidney disease Neg Hx    Stroke Neg Hx    Alcohol abuse Neg Hx    Colon cancer Neg Hx    Colon polyps Neg Hx    Esophageal cancer Neg Hx    Rectal cancer Neg Hx    Stomach cancer Neg Hx    Social History: Blayze lives in a house, no water damage, carpet in home, electric heating, central AC, no pets, no cockroaches, not using dust  mite protection on bedding, no smoke exposure.  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  + HEPA filter in the home.  [place]   ROS:  All other systems negative except as noted per HPI.  Objective:  Blood pressure 126/80, pulse 72, temperature 98.3 F (36.8 C), temperature source Temporal, resp. rate 20, height 6' (1.829 m), weight (!) 326 lb 12.8 oz (148.2 kg), SpO2 98 %. Body mass index is 44.32 kg/m. Physical Exam:  General Appearance:  Alert, cooperative, no distress, appears stated age  Head:  Normocephalic, without obvious abnormality, atraumatic  Eyes:  Conjunctiva clear, EOM's intact  Nose: Nares normal, hypertrophic bilateral turbinates, septum midline, no visible polyp  Throat: Lips, tongue normal; teeth and gums normal, normal posterior oropharynx  Neck: Supple, symmetrical  Lungs:   Clear to auscultation bilaterally, respirations unlabored, no coughing  Heart:  Regular rate and rhythm, no murmur appears well perfused  Extremities: No edema  Skin: Postinflammatory macules and papules scattered throughout back and upper extremities  Neurologic: No gross deficits   Diagnostics: Spirometry:  Tracings reviewed. His effort: Good reproducible efforts. FVC: 3.74L (pre), 4.64L  (post) +24% FEV1: 1.95L, 53% predicted (pre), 2.22L, 60% predicted (post) +14% FEV1/FVC ratio: 65% (pre), 60% (post) Interpretation: Spirometry consistent with moderate obstructive disease with significant bronchodilator response  Please see scanned spirometry results for details.  Skin Testing: Environmental allergy panel and select foods. Adequate positive and negative controls. Results discussed with patient/family.  Airborne Adult Perc - 01/14/21 0921     Time Antigen Placed 5188    Allergen Manufacturer Lavella Hammock    Location Back    Number of Test 59    1. Control-Buffer 50% Glycerol Negative    2. Control-Histamine 1  mg/ml 3+    3. Albumin saline Negative    4. Jayton Negative    5. [place] Negative    6. Johnson Negative    7. [place] Blue 2+    8. Meadow Fescue 2+    9. Perennial Rye Negative    10. Sweet Vernal Negative    11. Timothy Negative    12. Cocklebur Negative    13. Burweed Marshelder Negative    14. Ragweed, short Negative    15. Ragweed, Giant Negative    16. Plantain,  English Negative    17. Lamb's Quarters Negative    18. Sheep Sorrell Negative    19. Rough Pigweed Negative    20. Marsh Elder, Rough Negative    21. Mugwort, Common Negative    22. Ash mix Negative    23. Birch mix Negative    24. Steva Colder American Negative  25. Box, Elder Negative    26. Cedar, red Negative    27. Cottonwood, [place] Negative    28. Elm mix Negative    29. Hickory Negative    30. Maple mix Negative    31. Oak, [place] mix Negative    32. Pecan Pollen Negative    33. Pine mix Negative    34. Sycamore Eastern Negative    35. [place], Black Pollen Negative    36. Alternaria alternata Negative    37. Cladosporium Herbarum Negative    38. Aspergillus mix Negative    39. Penicillium mix Negative    40. Bipolaris sorokiniana (Helminthosporium) Negative    41. Drechslera spicifera (Curvularia) Negative    42. Mucor plumbeus Negative    43. Fusarium moniliforme Negative    44. Aureobasidium pullulans (pullulara) Negative    45. Rhizopus oryzae Negative    46. Botrytis cinera Negative    47. Epicoccum nigrum Negative    48. Phoma betae Negative    49. Candida Albicans Negative    50. Trichophyton mentagrophytes Negative    51. Mite, D Farinae  5,000 AU/ml Negative    52. Mite, D Pteronyssinus  5,000 AU/ml Negative    53. Cat Hair 10,000 BAU/ml Negative    54.  Dog Epithelia Negative    55. Mixed Feathers Negative    56. Horse Epithelia Negative    57. Cockroach, German Negative    58. Mouse Negative             Food Perc - 01/14/21 6789       Test Information   Time Antigen  Placed 3810    Allergen Manufacturer Lavella Hammock    Location Back    Number of allergen test 10      Food   1. Peanut Negative    2. Soybean food Negative    3. Wheat, whole Negative    4. Sesame Negative    5. Milk, cow Negative    6. Egg White, chicken Negative    7. Casein Negative    8. Shellfish mix Negative    9. Fish mix Negative    10. Cashew Negative             Intradermal - 01/14/21 1004     Time Antigen Placed 1004    Allergen Manufacturer Lavella Hammock    Location Arm    Number of Test 14    Control Negative    [place] --   2   Johnson 2+    Ragweed mix Negative    Weed mix Negative    Tree mix Negative    Mold 1 Negative    Mold 2 Negative    Mold 3 Negative    Mold 4 2+    Cat Negative    Dog Negative    Cockroach Negative    Mite mix Negative             Allergy testing results were read and interpreted by myself, documented by clinical staff.  Assessment and Plan  Chronic rhinitis-skin testing and intradermal testing overall unimpressive with exception of grass allergy.  However, his symptoms are year-round.  Suspect a nonallergic component to symptoms.  Discussed plan as below.  Most interesting is his spirometry which would indicate severe obstruction with significant response to bronchodilator consistent with asthma.  He does not perceive his symptoms and/or has been attributing them to upper airway drainage and cough.  Discussed that for food allergy testing, a  concerning history is necessary prior to skin testing as positive results are not uncommon.  Testing ultimately performed for patient reassurance and was negative to top 10 allergens  Patient Instructions  Chronic Rhinitis-mixed allergic and nonallergic: - allergy testing today was positive to grass pollen and mold mix for - allergen avoidance as below - consider allergy shots as long term control of your symptoms by teaching your immune system to be more tolerant of your allergy triggers -  Continue over the counter antihistamine daily or daily as needed.   -Your options include Zyrtec (Cetirizine) 10mg , Claritin (Loratadine) 10mg , Allegra (Fexofenadine) 180mg , or Xyzal (Levocetirinze) 5mg  -Start Mucinex 600 mg twice a day as needed.  Drink plenty of water while taking this.   Moderate persistent asthma-uncontrolled: - your lung testing today showed severe obstruction that improved significantly with a bronchodilator which is consistent with asthma, suspect unperceived - Controller Inhaler: Start Symbicort 80 2 puffs twice a day; This Should Be Used Everyday - Rinse mouth out after use - Rescue Inhaler: Albuterol (Proair/Ventolin) 2 puffs . Use  every 4-6 hours as needed for chest tightness, wheezing, or coughing.  Can also use 15 minutes prior to exercise if you have symptoms with activity. - Asthma is not controlled if:  - Symptoms are occurring >2 times a week OR  - >2 times a month nighttime awakenings  - You are requiring systemic steroids (prednisone/steroid injections) more than once per year  - Your require hospitalization for your asthma.  - Please call the clinic to schedule a follow up if these symptoms arise  Follow-up in 6 to 8 weeks.  Sooner if needed.    This note in its entirety was forwarded to the Provider who requested this consultation.  Thank you for your kind referral. I appreciate the opportunity to take part in Servando's care. Please do not hesitate to contact me with questions.  Sincerely,  Sigurd Sos, MD [facility]

## 2021-01-14 ENCOUNTER — Ambulatory Visit: Payer: BC Managed Care – PPO | Admitting: Internal Medicine

## 2021-01-14 ENCOUNTER — Encounter: Payer: Self-pay | Admitting: Internal Medicine

## 2021-01-14 ENCOUNTER — Ambulatory Visit (INDEPENDENT_AMBULATORY_CARE_PROVIDER_SITE_OTHER): Payer: BC Managed Care – PPO | Admitting: Internal Medicine

## 2021-01-14 ENCOUNTER — Institutional Professional Consult (permissible substitution): Payer: BC Managed Care – PPO | Admitting: Neurology

## 2021-01-14 ENCOUNTER — Other Ambulatory Visit: Payer: Self-pay

## 2021-01-14 VITALS — BP 126/80 | HR 72 | Temp 98.3°F | Resp 20 | Ht 72.0 in | Wt 326.8 lb

## 2021-01-14 DIAGNOSIS — T781XXA Other adverse food reactions, not elsewhere classified, initial encounter: Secondary | ICD-10-CM

## 2021-01-14 DIAGNOSIS — J310 Chronic rhinitis: Secondary | ICD-10-CM | POA: Diagnosis not present

## 2021-01-14 DIAGNOSIS — J4540 Moderate persistent asthma, uncomplicated: Secondary | ICD-10-CM | POA: Diagnosis not present

## 2021-01-14 DIAGNOSIS — T7840XA Allergy, unspecified, initial encounter: Secondary | ICD-10-CM

## 2021-01-14 DIAGNOSIS — J301 Allergic rhinitis due to pollen: Secondary | ICD-10-CM

## 2021-01-14 MED ORDER — GUAIFENESIN ER 600 MG PO TB12: 600.0000 mg | ORAL_TABLET | Freq: Two times a day (BID) | ORAL | 5 refills | Status: DC | PRN

## 2021-01-14 MED ORDER — LEVOCETIRIZINE DIHYDROCHLORIDE 5 MG PO TABS: 5.0000 mg | ORAL_TABLET | Freq: Two times a day (BID) | ORAL | 5 refills | Status: AC | PRN

## 2021-01-14 MED ORDER — ALBUTEROL SULFATE HFA 108 (90 BASE) MCG/ACT IN AERS: 2.0000 | INHALATION_SPRAY | Freq: Four times a day (QID) | RESPIRATORY_TRACT | 2 refills | Status: DC | PRN

## 2021-01-14 MED ORDER — BUDESONIDE-FORMOTEROL FUMARATE 80-4.5 MCG/ACT IN AERO: 2.0000 | INHALATION_SPRAY | Freq: Two times a day (BID) | RESPIRATORY_TRACT | 12 refills | Status: DC

## 2021-01-14 NOTE — Patient Instructions (Signed)
Chronic Rhinitis-mixed allergic and nonallergic: - allergy testing today was positive to grass pollen and mold mix for - allergen avoidance as below - consider allergy shots as long term control of your symptoms by teaching your immune system to be more tolerant of your allergy triggers - Continue over the counter antihistamine daily or daily as needed.   -Your options include Zyrtec (Cetirizine) 10mg , Claritin (Loratadine) 10mg , Allegra (Fexofenadine) 180mg , or Xyzal (Levocetirinze) 5mg  -Start Mucinex 600 mg twice a day as needed.  Drink plenty of water while taking this.   Moderate persistent asthma-uncontrolled: - your lung testing today showed severe obstruction that improved significantly with a bronchodilator which is consistent with asthma, suspect unperceived - Controller Inhaler: Start Symbicort 80 2 puffs twice a day; This Should Be Used Everyday - Rinse mouth out after use - Rescue Inhaler: Albuterol (Proair/Ventolin) 2 puffs . Use  every 4-6 hours as needed for chest tightness, wheezing, or coughing.  Can also use 15 minutes prior to exercise if you have symptoms with activity. - Asthma is not controlled if:  - Symptoms are occurring >2 times a week OR  - >2 times a month nighttime awakenings  - You are requiring systemic steroids (prednisone/steroid injections) more than once per year  - Your require hospitalization for your asthma.  - Please call the clinic to schedule a follow up if these symptoms arise  Follow-up in 6 to 8 weeks.  Sooner if needed.  Reducing Pollen Exposure  The American Academy of Allergy, Asthma and Immunology suggests the following steps to reduce your exposure to pollen during allergy seasons.    Do not hang sheets or clothing out to dry; pollen may collect on these items. Do not mow lawns or spend time around freshly cut grass; mowing stirs up pollen. Keep windows closed at night.  Keep car windows closed while driving. Minimize morning activities  outdoors, a time when pollen counts are usually at their highest. Stay indoors as much as possible when pollen counts or humidity is high and on windy days when pollen tends to remain in the air longer. Use air conditioning when possible.  Many air conditioners have filters that trap the pollen spores. Use a HEPA room air filter to remove pollen form the indoor air you breathe.  Control of Mold Allergen   Mold and fungi can grow on a variety of surfaces provided certain temperature and moisture conditions exist.  Outdoor molds grow on plants, decaying vegetation and soil.  The major outdoor mold, Alternaria and Cladosporium, are found in very high numbers during hot and dry conditions.  Generally, a late Summer - Fall peak is seen for common outdoor fungal spores.  Rain will temporarily lower outdoor mold spore count, but counts rise rapidly when the rainy period ends.  The most important indoor molds are Aspergillus and Penicillium.  Dark, humid and poorly ventilated basements are ideal sites for mold growth.  The next most common sites of mold growth are the bathroom and the kitchen.  Indoor (Perennial) Mold Control   Positive indoor molds via skin testing: Fusarium, Aureobasidium (Pullulara), and Rhizopus  Maintain humidity below 50%. Clean washable surfaces with 5% bleach solution. Remove sources e.g. contaminated carpets.

## 2021-01-21 ENCOUNTER — Ambulatory Visit (AMBULATORY_SURGERY_CENTER): Payer: BC Managed Care – PPO | Admitting: Internal Medicine

## 2021-01-21 ENCOUNTER — Encounter: Payer: Self-pay | Admitting: Internal Medicine

## 2021-01-21 VITALS — BP 110/68 | HR 65 | Temp 97.8°F | Resp 16 | Ht 73.0 in | Wt 326.0 lb

## 2021-01-21 DIAGNOSIS — K635 Polyp of colon: Secondary | ICD-10-CM | POA: Diagnosis not present

## 2021-01-21 DIAGNOSIS — D124 Benign neoplasm of descending colon: Secondary | ICD-10-CM

## 2021-01-21 DIAGNOSIS — Z1211 Encounter for screening for malignant neoplasm of colon: Secondary | ICD-10-CM

## 2021-01-21 MED ORDER — SODIUM CHLORIDE 0.9 % IV SOLN: 500.0000 mL | Freq: Once | INTRAVENOUS | Status: DC

## 2021-01-21 NOTE — Progress Notes (Signed)
Report given to PACU, vss 

## 2021-01-21 NOTE — Progress Notes (Signed)
Called to room to assist during endoscopic procedure.  Patient ID and intended procedure confirmed with present staff. Received instructions for my participation in the procedure from the performing physician.  

## 2021-01-21 NOTE — Op Note (Signed)
[place] Patient Name: Tyler Copeland Procedure Date: 01/21/2021 9:55 AM MRN: [id number] Endoscopist: Jerene Bears , MD Age: 45 Referring MD:  Date of Birth: [birth year] Gender: Male Account #: [id number] Procedure:                Colonoscopy Indications:              Screening for colorectal malignant neoplasm, This                            is the patient's first colonoscopy Medicines:                Monitored Anesthesia Care Procedure:                Pre-Anesthesia Assessment:                           - Prior to the procedure, a History and Physical                            was performed, and patient medications and                            allergies were reviewed. The patient's tolerance of                            previous anesthesia was also reviewed. The risks                            and benefits of the procedure and the sedation                            options and risks were discussed with the patient.                            All questions were answered, and informed consent                            was obtained. Prior Anticoagulants: The patient has                            taken no previous anticoagulant or antiplatelet                            agents. ASA Grade Assessment: III - A patient with                            severe systemic disease. After reviewing the risks                            and benefits, the patient was deemed in                            satisfactory condition to undergo the procedure.  After obtaining informed consent, the colonoscope                            was passed under direct vision. Throughout the                            procedure, the patient's blood pressure, pulse, and                            oxygen saturations were monitored continuously. The                            Olympus CF-HQ190L [id number]) Colonoscope was                            introduced through the anus  and advanced to the                            cecum, identified by appendiceal orifice and                            ileocecal valve. The colonoscopy was performed                            without difficulty. The patient tolerated the                            procedure well. The quality of the bowel                            preparation was good. The ileocecal valve,                            appendiceal orifice, and rectum were photographed. Scope In: 10:03:49 AM Scope Out: 10:18:25 AM Scope Withdrawal Time: 0 hours 12 minutes 10 seconds  Total Procedure Duration: 0 hours 14 minutes 36 seconds  Findings:                 The digital rectal exam was normal.                           A 5 mm polyp was found in the descending colon. The                            polyp was sessile. The polyp was removed with a                            cold snare. Resection and retrieval were complete.                           A few small-mouthed diverticula were found in the                            sigmoid colon.  The exam was otherwise without abnormality on                            direct and retroflexion views. Complications:            No immediate complications. Estimated Blood Loss:     Estimated blood loss: none. Impression:               - One 5 mm polyp in the descending colon, removed                            with a cold snare. Resected and retrieved.                           - Diverticulosis in the sigmoid colon.                           - The examination was otherwise normal on direct                            and retroflexion views. Recommendation:           - Patient has a contact number available for                            emergencies. The signs and symptoms of potential                            delayed complications were discussed with the                            patient. Return to normal activities tomorrow.                             Written discharge instructions were provided to the                            patient.                           - Resume previous diet.                           - Continue present medications.                           - Await pathology results.                           - Repeat colonoscopy is recommended. The                            colonoscopy date will be determined after pathology                            results from today's exam become available for  review. Jerene Bears, MD 01/21/2021 10:22:36 AM This report has been signed electronically.

## 2021-01-21 NOTE — Patient Instructions (Signed)
Resume previous medications.  Diverticulosis.  1 polyp removed and sent to pathology.  Await results for final recommendations.  Handouts on findings given to patient.     YOU HAD AN ENDOSCOPIC PROCEDURE TODAY AT [facility]:   Refer to the procedure report that was given to you for any specific questions about what was found during the examination.  If the procedure report does not answer your questions, please call your gastroenterologist to clarify.  If you requested that your care partner not be given the details of your procedure findings, then the procedure report has been included in a sealed envelope for you to review at your convenience later.  YOU SHOULD EXPECT: Some feelings of bloating in the abdomen. Passage of more gas than usual.  Walking can help get rid of the air that was put into your GI tract during the procedure and reduce the bloating. If you had a lower endoscopy (such as a colonoscopy or flexible sigmoidoscopy) you may notice spotting of blood in your stool or on the toilet paper. If you underwent a bowel prep for your procedure, you may not have a normal bowel movement for a few days.  Please Note:  You might notice some irritation and congestion in your nose or some drainage.  This is from the oxygen used during your procedure.  There is no need for concern and it should clear up in a day or so.  SYMPTOMS TO REPORT IMMEDIATELY:  Following lower endoscopy (colonoscopy or flexible sigmoidoscopy):  Excessive amounts of blood in the stool  Significant tenderness or worsening of abdominal pains  Swelling of the abdomen that is new, acute  Fever of 100F or higher   For urgent or emergent issues, a gastroenterologist can be reached at any hour by calling [phone]. Do not use MyChart messaging for urgent concerns.    DIET:  We do recommend a small meal at first, but then you may proceed to your regular diet.  Drink plenty of fluids but you should avoid  alcoholic beverages for 24 hours.  ACTIVITY:  You should plan to take it easy for the rest of today and you should NOT DRIVE or use heavy machinery until tomorrow (because of the sedation medicines used during the test).    FOLLOW UP: Our staff will call the number listed on your records 48-72 hours following your procedure to check on you and address any questions or concerns that you may have regarding the information given to you following your procedure. If we do not reach you, we will leave a message.  We will attempt to reach you two times.  During this call, we will ask if you have developed any symptoms of COVID 19. If you develop any symptoms (ie: fever, flu-like symptoms, shortness of breath, cough etc.) before then, please call [phone].  If you test positive for Covid 19 in the 2 weeks post procedure, please call and report this information to [place].    If any biopsies were taken you will be contacted by phone or by letter within the next 1-3 weeks.  Please call us at [phone] if you have not heard about the biopsies in 3 weeks.    SIGNATURES/CONFIDENTIALITY: You and/or your care partner have signed paperwork which will be entered into your electronic medical record.  These signatures attest to the fact that that the information above on your After Visit Summary has been reviewed and is understood.  Full responsibility of the  confidentiality of this discharge information lies with you and/or your care-partner.

## 2021-01-21 NOTE — Progress Notes (Signed)
GASTROENTEROLOGY PROCEDURE H&P NOTE   Primary Care Physician: Janith Lima, MD    Reason for Procedure:  Colon cancer screening  Plan:    Colonoscopy  Patient is appropriate for endoscopic procedure(s) in the ambulatory ([facility]) setting.  The nature of the procedure, as well as the risks, benefits, and alternatives were carefully and thoroughly reviewed with the patient. Ample time for discussion and questions allowed. The patient understood, was satisfied, and agreed to proceed.     HPI: Tyler Copeland is a 45 y.o. male who presents for colonoscopy for screening.  Medical history as below.  Tolerated the prep.  No recent chest pain or shortness of breath.  No abdominal pain today.  Past Medical History:  Diagnosis Date   Allergy    Diabetes mellitus without complication ([place])    no meds   Hyperlipidemia    Hypertension    Pemphigus    Plantar fasciitis     Past Surgical History:  Procedure Laterality Date   FINGER SURGERY     pinkie finger surgery to repair tendon   FOOT SURGERY Bilateral 01/31/2010   [facility]   MOUTH SURGERY N/A 2015    Prior to Admission medications   Medication Sig Start Date End Date Taking? Authorizing Provider  albuterol (VENTOLIN HFA) 108 (90 Base) MCG/ACT inhaler Inhale 2 puffs into the lungs every 6 (six) hours as needed for wheezing or shortness of breath. 01/14/21  Yes Sigurd Sos, MD  alfuzosin (UROXATRAL) 10 MG 24 hr tablet Take 10 mg by mouth daily with breakfast.   Yes [provider]  budesonide-formoterol (SYMBICORT) 80-4.5 MCG/ACT inhaler Inhale 2 puffs into the lungs in the morning and at bedtime. 01/14/21  Yes Sigurd Sos, MD  triamcinolone ointment (KENALOG) 0.1 % Apply topically as directed. 11/27/20  Yes [provider]  ARMOUR THYROID 30 MG tablet Take 30 mg by mouth daily. Patient not taking: Reported on 01/21/2021 09/12/20   [provider]  cetirizine (ZYRTEC) 10 MG tablet TAKE 1 TABLET (10 MG  TOTAL) BY MOUTH DAILY. Patient not taking: Reported on 01/21/2021 06/21/16   Janith Lima, MD  Cholecalciferol 50 MCG (2000 UT) TABS Take 1 tablet (2,000 Units total) by mouth daily. Patient not taking: Reported on 01/12/2021 08/26/20   Janith Lima, MD  Cyanocobalamin (VITAMIN B-12) 1000 MCG/15ML LIQD Take by mouth. Patient not taking: Reported on 01/21/2021    [provider]  guaiFENesin (MUCINEX) 600 MG 12 hr tablet Take 1 tablet (600 mg total) by mouth 2 (two) times daily as needed (Make sure you drink plenty of water.). Patient not taking: Reported on 01/21/2021 01/14/21   Sigurd Sos, MD  levocetirizine (XYZAL) 5 MG tablet Take 1 tablet (5 mg total) by mouth 2 (two) times daily as needed for allergies (Can take an extra dose during flare ups.). Patient not taking: Reported on 01/21/2021 01/14/21   Sigurd Sos, MD  Multiple Vitamin (MULTIVITAMIN) tablet Take 1 tablet by mouth daily. Patient not taking: Reported on 01/21/2021    [provider]    Current Outpatient Medications  Medication Sig Dispense Refill   albuterol (VENTOLIN HFA) 108 (90 Base) MCG/ACT inhaler Inhale 2 puffs into the lungs every 6 (six) hours as needed for wheezing or shortness of breath. 8 g 2   alfuzosin (UROXATRAL) 10 MG 24 hr tablet Take 10 mg by mouth daily with breakfast.     budesonide-formoterol (SYMBICORT) 80-4.5 MCG/ACT inhaler Inhale 2 puffs into the lungs in the morning  and at bedtime. 1 each 12   triamcinolone ointment (KENALOG) 0.1 % Apply topically as directed.     ARMOUR THYROID 30 MG tablet Take 30 mg by mouth daily. (Patient not taking: Reported on 01/21/2021)     cetirizine (ZYRTEC) 10 MG tablet TAKE 1 TABLET (10 MG TOTAL) BY MOUTH DAILY. (Patient not taking: Reported on 01/21/2021) 30 tablet 10   Cholecalciferol 50 MCG (2000 UT) TABS Take 1 tablet (2,000 Units total) by mouth daily. (Patient not taking: Reported on 01/12/2021) 90 tablet 1   Cyanocobalamin (VITAMIN B-12)  1000 MCG/15ML LIQD Take by mouth. (Patient not taking: Reported on 01/21/2021)     guaiFENesin (MUCINEX) 600 MG 12 hr tablet Take 1 tablet (600 mg total) by mouth 2 (two) times daily as needed (Make sure you drink plenty of water.). (Patient not taking: Reported on 01/21/2021) 60 tablet 5   levocetirizine (XYZAL) 5 MG tablet Take 1 tablet (5 mg total) by mouth 2 (two) times daily as needed for allergies (Can take an extra dose during flare ups.). (Patient not taking: Reported on 01/21/2021) 60 tablet 5   Multiple Vitamin (MULTIVITAMIN) tablet Take 1 tablet by mouth daily. (Patient not taking: Reported on 01/21/2021)     Current Facility-Administered Medications  Medication Dose Route Frequency Provider Last Rate Last Admin   0.9 %  sodium chloride infusion  500 mL Intravenous Once Merilynn Haydu, Lajuan Lines, MD        Allergies as of 01/21/2021 - Review Complete 01/21/2021  Allergen Reaction Noted   Codeine  07/20/2010   Hydrocodone-acetaminophen      Family History  Problem Relation Age of Onset   Diabetes Other    Hypertension Other    Cancer Neg Hx    Early death Neg Hx    Heart disease Neg Hx    Hyperlipidemia Neg Hx    Kidney disease Neg Hx    Stroke Neg Hx    Alcohol abuse Neg Hx    Colon cancer Neg Hx    Colon polyps Neg Hx    Esophageal cancer Neg Hx    Rectal cancer Neg Hx    Stomach cancer Neg Hx     Social History   Socioeconomic History   Marital status: Married    Spouse name: Joelene Millin   Number of children: Not on file   Years of education: BS   Highest education level: Not on file  Occupational History   Occupation: Paediatric nurse  Tobacco Use   Smoking status: Never   Smokeless tobacco: Never  Substance and Sexual Activity   Alcohol use: No   Drug use: No   Sexual activity: Yes  Other Topics Concern   Not on file  Social History Narrative   Regular exercise-No   Caffeine Herbal tea, some 2 cups  occasionall of caffeine.   Social Determinants  of Health   Financial Resource Strain: Not on file  Food Insecurity: Not on file  Transportation Needs: Not on file  Physical Activity: Not on file  Stress: Not on file  Social Connections: Not on file  Intimate Partner Violence: Not on file    Physical Exam: Vital signs in last 24 hours: @BP  (!) 114/55    Pulse 74    Temp 97.8 F (36.6 C) (Skin)    Ht 6\' 1"  (1.854 m)    Wt (!) 326 lb (147.9 kg)    SpO2 98%    BMI 43.01 kg/m  GEN: NAD EYE: Sclerae  anicteric ENT: MMM CV: Non-tachycardic Pulm: CTA b/l GI: Soft, NT/ND NEURO:  Alert & Oriented x 3   Zenovia Jarred, MD [facility]  01/21/2021 9:53 AM

## 2021-01-26 ENCOUNTER — Telehealth: Payer: Self-pay

## 2021-01-26 NOTE — Telephone Encounter (Signed)
°  Follow up Call-  Call back number 01/21/2021  Post procedure Call Back phone  # [phone]  Permission to leave phone message Yes  Some recent data might be hidden     Patient questions:  Do you have a fever, pain , or abdominal swelling? No. Pain Score  0 *  Have you tolerated food without any problems? Yes.    Have you been able to return to your normal activities? Yes.    Do you have any questions about your discharge instructions: Diet   No. Medications  No. Follow up visit  No.  Do you have questions or concerns about your Care? No.  Actions: * If pain score is 4 or above: No action needed, pain <4.  Have you developed a fever since your procedure? no  2.   Have you had an respiratory symptoms (SOB or cough) since your procedure? no  3.   Have you tested positive for COVID 19 since your procedure no  4.   Have you had any family members/close contacts diagnosed with the COVID 19 since your procedure?  no   If yes to any of these questions please route to Joylene John, RN and Joella Prince, RN

## 2021-02-02 ENCOUNTER — Encounter: Payer: Self-pay | Admitting: Internal Medicine

## 2021-03-11 ENCOUNTER — Ambulatory Visit: Payer: BC Managed Care – PPO | Admitting: Internal Medicine

## 2021-03-12 ENCOUNTER — Ambulatory Visit: Payer: BC Managed Care – PPO | Admitting: Internal Medicine

## 2021-03-22 ENCOUNTER — Ambulatory Visit: Payer: BC Managed Care – PPO | Admitting: Internal Medicine

## 2021-05-04 NOTE — Progress Notes (Deleted)
? ?FOLLOW UP ?Date of Service/Encounter:  05/04/21 ? ? ?Subjective:  ?Tyler Copeland (DOB: February 08, 1975) is a 46 y.o. male PMHx of chronic rhinitis, hypertension, sleep apnea, obesity, prediabetes, vitamin D deficiency, pemphigus foliaceous [facility] in re-evaluation of the following: *** ?History obtained from: chart review and {Persons; PED relatives w/patient:19415::"patient"}. ? ?For Review, LV was on 01/14/21  with Dr.Parag Dorton.  We did start Symbicort 80, 2 puffs twice a day, Mucinex, and a daily antihistamine. ? ?Pertinent History/Diagnostics:  ?- Asthma: *** ? - pre/post spirometry (01/14/21): ratio 65%, 53% FEV1 (pre), + 14% improvement FEV1 (post) and +24% improvement in FVC = moderate obstructive disease with significant bronchodilator response ?- Allergic Rhinitis:  ? - SPT environmental panel (01/14/21): grass pollen, IDs + to [place], [place], mold 4 ?- no food allergy-patient concerned about food trigger for pemphigus foliaceous ? - SPT select foods (01/14/21): negative to top 9 most common allergens ? ? ? ? ? ?Allergies as of 05/05/2021   ? ?   Reactions  ? Codeine   ? Codeine- Hallucinations  ? Hydrocodone-acetaminophen   ? Visual hallucinations & "shakes"- Vicodin   ? ?  ? ?  ?Medication List  ?  ? ?  ? Accurate as of May 04, 2021  8:46 AM. If you have any questions, ask your nurse or doctor.  ?  ?  ? ?  ? ?albuterol 108 (90 Base) MCG/ACT inhaler ?Commonly known as: VENTOLIN HFA ?Inhale 2 puffs into the lungs every 6 (six) hours as needed for wheezing or shortness of breath. ?  ?alfuzosin 10 MG 24 hr tablet ?Commonly known as: UROXATRAL ?Take 10 mg by mouth daily with breakfast. ?  ?Armour Thyroid 30 MG tablet ?Generic drug: thyroid ?Take 30 mg by mouth daily. ?  ?budesonide-formoterol 80-4.5 MCG/ACT inhaler ?Commonly known as: Symbicort ?Inhale 2 puffs into the lungs in the morning and at bedtime. ?  ?cetirizine 10 MG tablet ?Commonly known as:  ZYRTEC ?TAKE 1 TABLET (10 MG TOTAL) BY MOUTH DAILY. ?  ?Cholecalciferol 50 MCG (2000 UT) Tabs ?Take 1 tablet (2,000 Units total) by mouth daily. ?  ?guaiFENesin 600 MG 12 hr tablet ?Commonly known as: Mucinex ?Take 1 tablet (600 mg total) by mouth 2 (two) times daily as needed (Make sure you drink plenty of water.). ?  ?levocetirizine 5 MG tablet ?Commonly known as: XYZAL ?Take 1 tablet (5 mg total) by mouth 2 (two) times daily as needed for allergies (Can take an extra dose during flare ups.). ?  ?multivitamin tablet ?Take 1 tablet by mouth daily. ?  ?triamcinolone ointment 0.1 % ?Commonly known as: KENALOG ?Apply topically as directed. ?  ?Vitamin B-12 1000 MCG/15ML Liqd ?Take by mouth. ?  ? ?  ? ?Past Medical History:  ?Diagnosis Date  ? Allergy   ? Diabetes mellitus without complication ([place])   ? no meds  ? Hyperlipidemia   ? Hypertension   ? Pemphigus   ? Plantar fasciitis   ? ?Past Surgical History:  ?Procedure Laterality Date  ? FINGER SURGERY    ? pinkie finger surgery to repair tendon  ? FOOT SURGERY Bilateral 01/31/2010  ? [facility]  ? MOUTH SURGERY N/A 2015  ? ?Otherwise, there have been no changes to his past medical history, surgical history, family history, or social history. ? ?ROS: All others negative except as noted per HPI.  ? ?Objective:  ?There were no vitals taken for this visit. ?There is no height or  weight on file to calculate BMI. ?Physical Exam: ?General Appearance:  Alert, cooperative, no distress, appears stated age  ?Head:  Normocephalic, without obvious abnormality, atraumatic  ?Eyes:  Conjunctiva clear, EOM's intact  ?Nose: Nares normal, {Blank multiple:19196:a:"***","hypertrophic turbinates","normal mucosa","no visible anterior polyps","septum midline"}  ?Throat: Lips, tongue normal; teeth and gums normal, {Blank multiple:19196:a:"***","normal posterior oropharynx","tonsils 2+","tonsils 3+","no tonsillar exudate","+ cobblestoning"}  ?Neck: Supple, symmetrical  ?Lungs:   {Blank  multiple:19196:a:"***","clear to auscultation bilaterally","end-expiratory wheezing","wheezing throughout"}, Respirations unlabored, {Blank multiple:19196:a:"***","no coughing","intermittent dry coughing"}  ?Heart:  {Blank multiple:19196:a:"***","regular rate and rhythm","no murmur"}, Appears well perfused  ?Extremities: No edema  ?Skin: Skin color, texture, turgor normal, no rashes or lesions on visualized portions of skin  ?Neurologic: No gross deficits  ? ?Reviewed: *** ? ?Spirometry:  ?Tracings reviewed. His effort: {Blank single:19197::"Good reproducible efforts.","It was hard to get consistent efforts and there is a question as to whether this reflects a maximal maneuver.","Poor effort, data can not be interpreted.","Variable effort-results affected.","decent for first attempt at spirometry."} ?FVC: ***L ?FEV1: ***L, ***% predicted ?FEV1/FVC ratio: ***% ?Interpretation: {Blank single:19197::"Spirometry consistent with mild obstructive disease","Spirometry consistent with moderate obstructive disease","Spirometry consistent with severe obstructive disease","Spirometry consistent with possible restrictive disease","Spirometry consistent with mixed obstructive and restrictive disease","Spirometry uninterpretable due to technique","Spirometry consistent with normal pattern","No overt abnormalities noted given today's efforts"}.  ?Please see scanned spirometry results for details. ? ?Skin Testing: {Blank single:19197::"Select foods","Environmental allergy panel","Environmental allergy panel and select foods","Food allergy panel","None","Deferred due to recent antihistamines use"}. ?Positive test to: ***. Negative test to: ***.  ?Results discussed with patient/family. ? ? ?{Blank single:19197::"Allergy testing results were read and interpreted by myself, documented by clinical staff."," "} ? ?Assessment/Plan  ? ?*** ? ?Sigurd Sos, MD  ?[facility] ? ? ? ? ? ? ?

## 2021-05-05 ENCOUNTER — Ambulatory Visit: Payer: BC Managed Care – PPO | Admitting: Internal Medicine

## 2021-06-17 IMAGING — CT CT FOOT*R* W/O CM
3 series · 11 of 33 positions shown, 13 images · non-contrast
Comparison: MRI 08/10/2018.  X-ray 08/06/2018

CLINICAL DATA: Chronic foot pain

EXAM:
CT OF THE RIGHT FOOT WITHOUT CONTRAST
TECHNIQUE: Multidetector CT imaging of the right foot was performed according
to the standard protocol. Multiplanar CT image reconstructions were
also generated.

[Series 4: soft tissue lower extremity · axial · 0.62mm/px · z∈[-193,-103]mm · 3 of 75 slices shown, 4 images]
[im 18/75  soft-tissue]
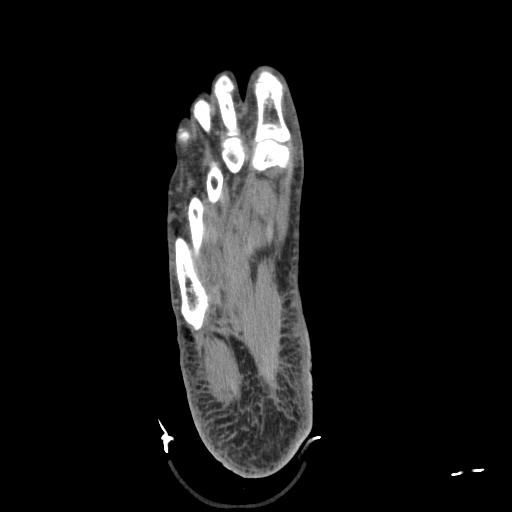
[im 18/75  bone]
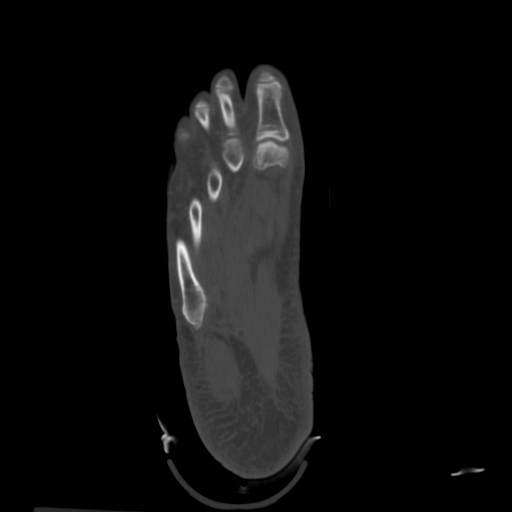
[im 40/75  bone]
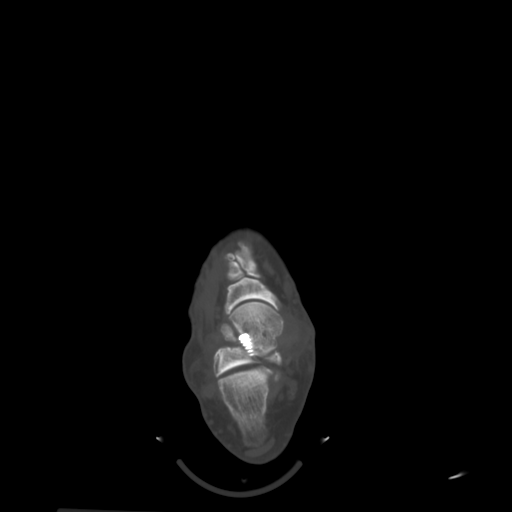
[im 63/75  bone]
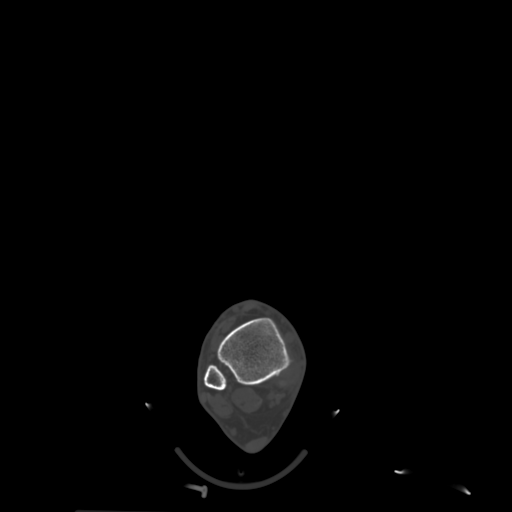

[Series 9: cor soft tissue · coronal · 0.29mm/px · 3 of 153 slices shown]
[im 31/153  bone]
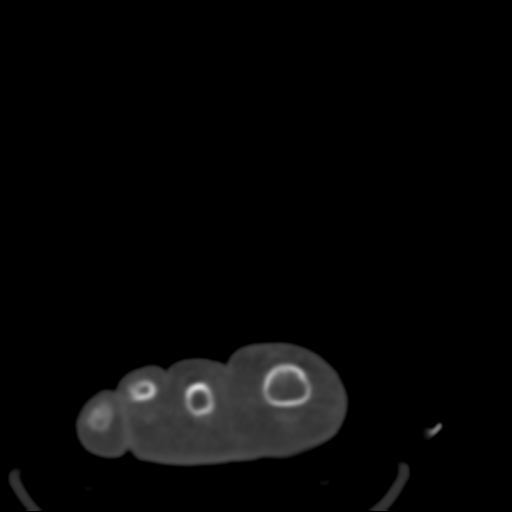
[im 61/153  bone]
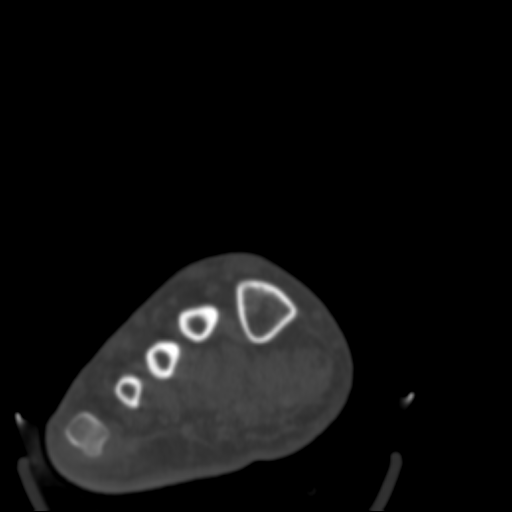
[im 92/153  bone]
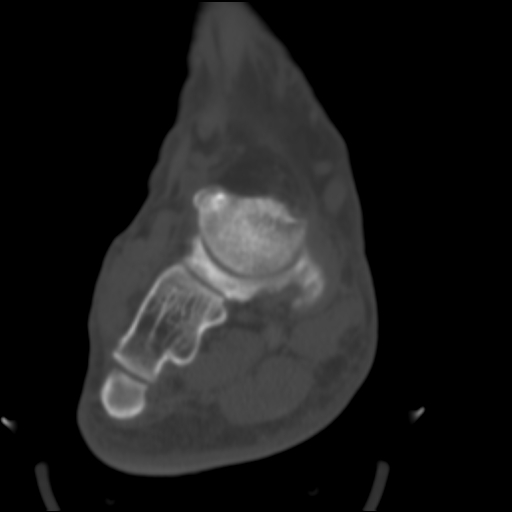

[Series 10: sagsoft tissue · sagittal · 0.29mm/px · 5 of 59 slices shown, 6 images]
[im 20/59  bone]
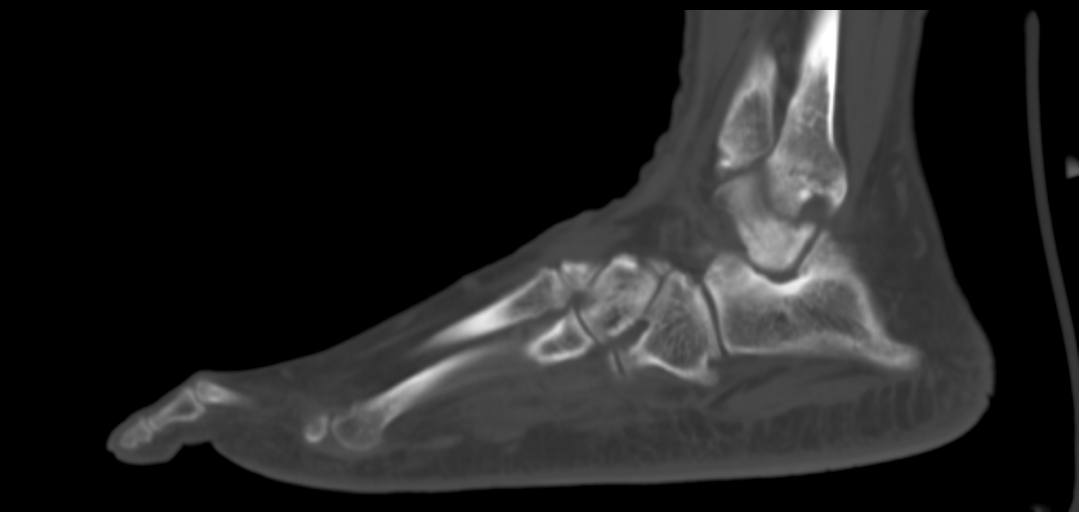
[im 25/59  bone]
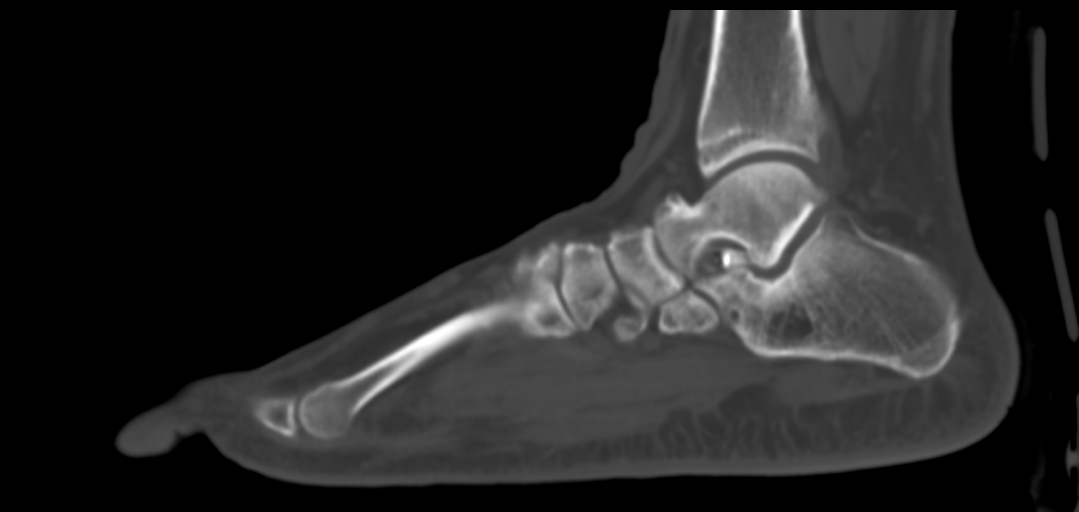
[im 30/59  soft-tissue]
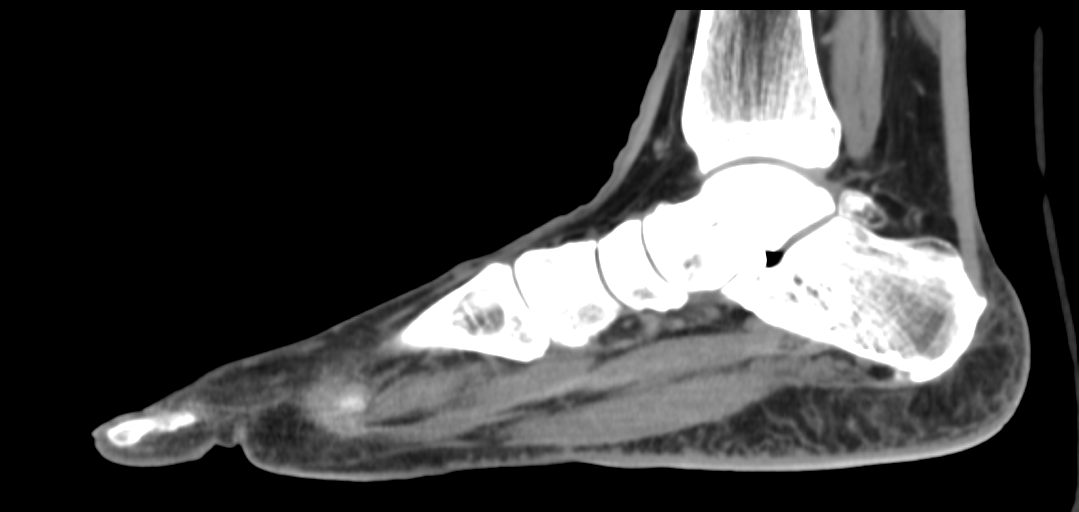
[im 30/59  bone]
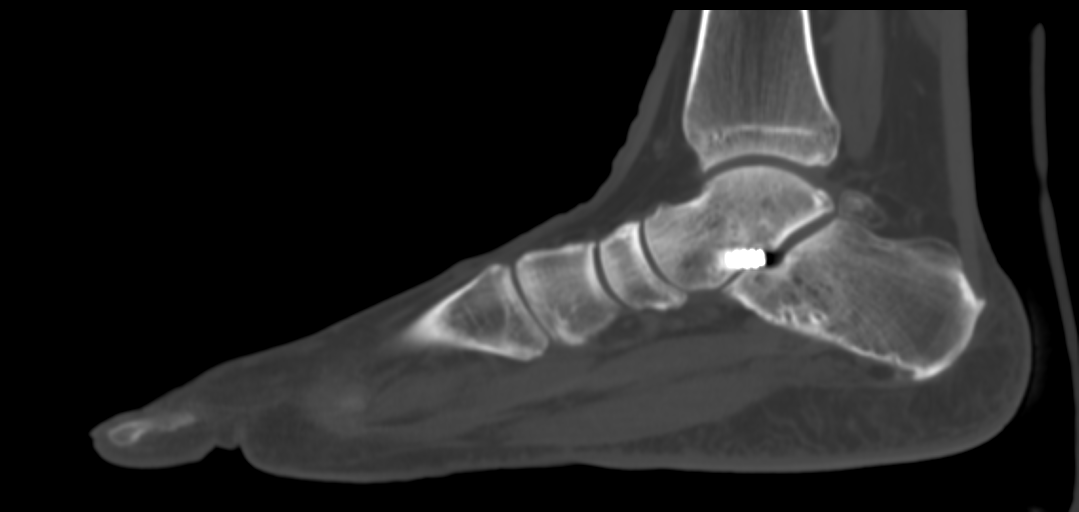
[im 34/59  bone]
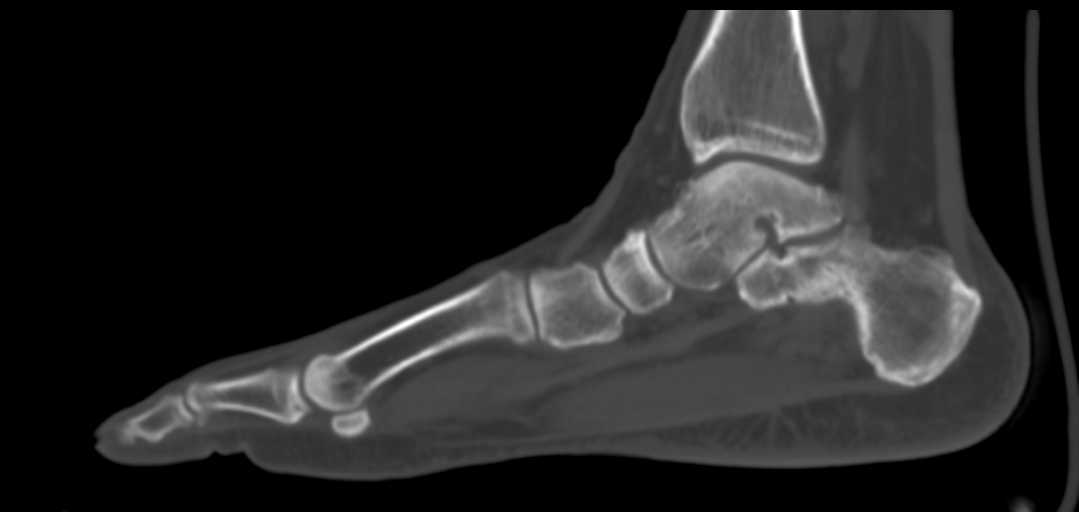
[im 39/59  bone]
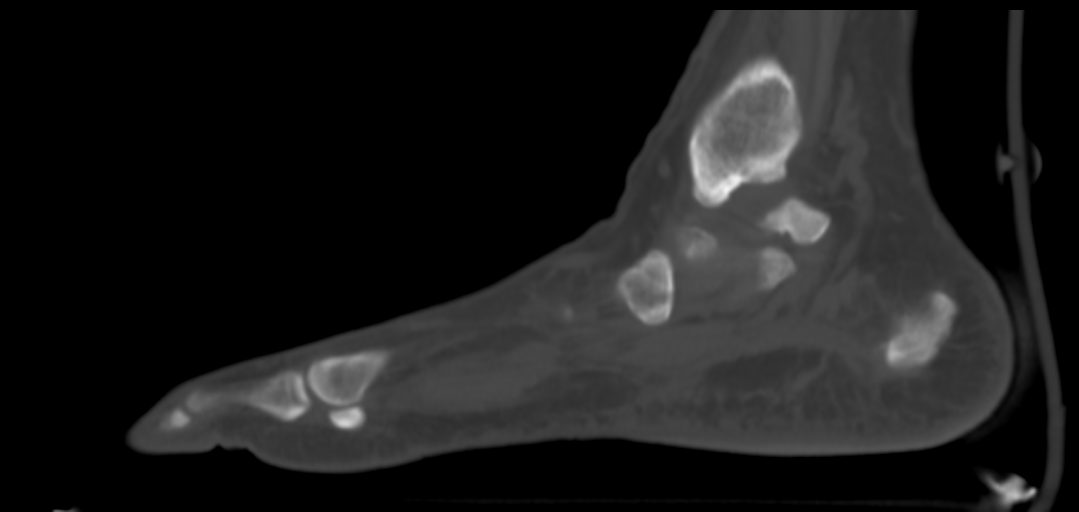

[11 of 33 positions shown; findings below may reference images not displayed]

FINDINGS: Bones/Joint/Cartilage

No acute fracture. No malalignment. Tibiotalar joint space is
maintained. Minimal degenerative spurring within the inferior
aspects of the medial and lateral gutters. No talar dome
osteochondral lesion is evident. Mild anterior osteophytosis of the
tibiotalar joint. Mild degenerative changes involving the anterior
and middle subtalar joints. The posterior subtalar joint space is
maintained. There is a prominent os trigonum. Small bidirectional
calcaneal enthesophytes. Minimal degenerative changes at the
calcaneocuboid and talonavicular joints within the midfoot. Mild
talonavicular dorsal hypertrophy. Surgical implant again noted
within the sinus tarsi. 1.3 x 0.6 cm ossification adjacent to the
hardware along its anterolateral margin, which may reflect sequela
of remote trauma.

Small os peroneum. Tarsometatarsal joint alignment is maintained.
Minimal joint space narrowing across the TMT joints. Small well
ossified body at the dorsal aspect of the second TMT joint.
Metatarsophalangeal and interphalangeal joint spaces are relatively
preserved.

No tibiotalar or subtalar joint effusion.

Ligaments

Suboptimally assessed by CT.

Muscles and Tendons

No muscular atrophy or fatty infiltration is evident. Thickened,
ill-defined appearance of the posterior tibialis tendon with fluid
distending the tendon sheath (series 4, image 36). The remaining
visualized tendons appear within normal limits.

Soft tissues

Remaining soft tissues are within normal limits.
IMPRESSION: 1. Mild degenerative changes of the right hindfoot and midfoot, as
detailed above.
2. Posterior tibialis tendinosis with tenosynovitis.

## 2021-08-28 ENCOUNTER — Other Ambulatory Visit: Payer: Self-pay | Admitting: Internal Medicine

## 2021-12-10 ENCOUNTER — Ambulatory Visit: Payer: BC Managed Care – PPO | Admitting: Podiatry

## 2021-12-10 ENCOUNTER — Ambulatory Visit (INDEPENDENT_AMBULATORY_CARE_PROVIDER_SITE_OTHER): Payer: BC Managed Care – PPO

## 2021-12-10 ENCOUNTER — Encounter: Payer: Self-pay | Admitting: Podiatry

## 2021-12-10 DIAGNOSIS — M2141 Flat foot [pes planus] (acquired), right foot: Secondary | ICD-10-CM | POA: Diagnosis not present

## 2021-12-10 DIAGNOSIS — M19072 Primary osteoarthritis, left ankle and foot: Secondary | ICD-10-CM | POA: Diagnosis not present

## 2021-12-10 DIAGNOSIS — M2142 Flat foot [pes planus] (acquired), left foot: Secondary | ICD-10-CM

## 2021-12-10 DIAGNOSIS — M722 Plantar fascial fibromatosis: Secondary | ICD-10-CM | POA: Diagnosis not present

## 2021-12-10 NOTE — Progress Notes (Signed)
Chief Complaint  Patient presents with   Foot Pain    "I have pain in my arch and up the sides of my feet." N - arch pain  L - bilateral D - 2010 O - gradually worse C - sharp pain, tender, sore A - walking all day. Stop working hurts T - surgery BOTH feet - 2012, inserts, Ibuprofen    Subjective:  46 y.o. male presenting today for second opinion of pain and tenderness associated to the bilateral feet left greater than the right.  Patient states that he does have a history of flatfoot reconstruction in 2012.  Patient states that gradually over the past several years he is slowly developed increased pain and tenderness especially to the left foot.  It is very tender and painful on a daily basis.  He has tried orthotics and currently wears orthotics with minimal relief.  He has seen multiple physicians in the past who recommended surgery.  CT scans were performed of the feet 10/05/2018.  Patient states that he has had progressive increased pain now for several years.  He presents for further treatment and evaluation   Past Medical History:  Diagnosis Date   Allergy    Diabetes mellitus without complication ([place])    no meds   Hyperlipidemia    Hypertension    Pemphigus    Plantar fasciitis    Past Surgical History:  Procedure Laterality Date   FINGER SURGERY     pinkie finger surgery to repair tendon   FOOT SURGERY Bilateral 01/31/2010   Arches   MOUTH SURGERY N/A 2015   Allergies  Allergen Reactions   Codeine     Codeine- Hallucinations   Hydrocodone-Acetaminophen     Visual hallucinations & "shakes"- Vicodin        Objective/Physical Exam General: The patient is alert and oriented x3 in no acute distress.  Dermatology: Skin is warm, dry and supple bilateral lower extremities. Negative for open lesions or macerations.  Vascular: Palpable pedal pulses bilaterally. No edema or erythema noted. Capillary refill within normal limits.  Neurological: Epicritic and  protective threshold grossly intact bilaterally.   Musculoskeletal Exam: Good range of motion to the ankle joint.  There is no pain on palpation to the ankle joint as well. Upon weightbearing there is a medial longitudinal arch collapse bilaterally. Rearfoot valgus noted to the bilateral lower extremities with excessive pronation upon mid stance.  Limited range of motion to the TN and subtalar joint especially the left foot  Radiographic Exam 12/10/2021 B/L feet:  LT foot: Advanced degenerative changes noted throughout the midtarsal joint with collapse of the medial longitudinal arch and flatfoot deformity.  No acute fractures identified.  Subtalar arthroeresis implant noted.  Posterior aspect of the talus and tibiotalar joint also appear to be degenerative.  RT foot: Much less degenerative changes noted throughout the right foot compared to the contralateral limb.  No acute fractures identified.  Subtalar arthroeresis implant is also noted within the sinus tarsi which appears stable and intact.    CT B/L feet 10/05/2018 LT foot: IMPRESSION: 1. Moderate degenerative changes of the left hindfoot and midfoot, as detailed above. 2. Probable posterior tibialis tendinosis. 3. Small amount of fluid adjacent to the FHL mild tendinous junction, which may reflect a component of tenosynovitis.  RT foot: IMPRESSION: 1. Mild degenerative changes of the right hindfoot and midfoot, as detailed above. 2. Posterior tibialis tendinosis with tenosynovitis.  Assessment: 1. 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  Symptomatic hardware b/l feet. Arthroeresis implant 4. PSxHx bilateral foot surgery 2012. Gastroc lengthening w/ arthroeresis implant  Plan of Care:  1. Patient was evaluated. X-Rays reviewed.  2.  Patient is very symptomatic to the left foot and has pain and tenderness on a daily basis.  He says when he gets out of bed he has severe pain and tenderness which takes about 30 minutes to  warm up.  When the foot warms up it does feel slightly better.  I explained that he is dealing with advanced arthritic changes especially in the left foot.  Given the advanced degenerative changes I do recommend surgery which would include triple arthrodesis of the left foot.  Patient has already undergone gastroc aponeurosis lengthening and I do not believe there is an equinus component to the patient's pathology currently.  Triple arthrodesis was explained in detail including the procedure, and postoperative recovery course which would include 8-12 weeks of strict nonweightbearing to the surgical extremity.  Patient understands.  Risk benefits advantages and disadvantages were explained.  No guarantees were expressed or implied.  All patient questions were answered.  The patient's spouse was also on the phone during our evaluation and all of her questions were answered as well. 3.  Today the patient would like to consent for surgical correction/triple arthrodesis of the left foot.  Authorization for surgery was initiated today.  Surgery will consist of triple arthrodesis left.  Removal of implant left. 4.  Past medical history was reviewed.  Patient does have a history of vitamin D deficiency however he is on vitamin D supplements.   5.  Return to clinic 1 week postop  *Wife's name is Joelene Millin who was on the phone during the visit today   Edrick Kins, DPM [facility]  Dr. Edrick Kins, DPM    [address], [place] [address]                Office [phone]  Fax [phone]

## 2021-12-14 ENCOUNTER — Telehealth: Payer: Self-pay

## 2021-12-14 NOTE — Telephone Encounter (Signed)
Received surgery paperwork from the [place] office. Spoke to [place] and he wants to call me back to schedule his surgery.

## 2021-12-28 ENCOUNTER — Other Ambulatory Visit: Payer: BC Managed Care – PPO

## 2022-01-17 ENCOUNTER — Other Ambulatory Visit: Payer: BC Managed Care – PPO

## 2022-01-26 ENCOUNTER — Other Ambulatory Visit: Payer: BC Managed Care – PPO

## 2022-02-07 ENCOUNTER — Encounter: Payer: Self-pay | Admitting: Podiatry

## 2022-02-07 ENCOUNTER — Ambulatory Visit: Payer: BC Managed Care – PPO | Admitting: *Deleted

## 2022-02-07 DIAGNOSIS — M2141 Flat foot [pes planus] (acquired), right foot: Secondary | ICD-10-CM

## 2022-02-07 NOTE — Progress Notes (Unsigned)
Patient presents today to be casted for custom molded orthotics. Dr. Amalia Hailey is the treating physician.    Impression foam cast was taken. ABN signed.  Patient info-  Shoe size: 12 W   Shoe style: DRESS  Height: 6FT  Weight: 330 LBS  Insurance: [employer]   Patient will be notified once orthotics arrive in office and reappoint for fitting at that time.

## 2022-02-24 ENCOUNTER — Encounter: Payer: BC Managed Care – PPO | Admitting: Internal Medicine

## 2022-03-21 ENCOUNTER — Other Ambulatory Visit: Payer: BC Managed Care – PPO

## 2022-04-04 ENCOUNTER — Other Ambulatory Visit: Payer: BC Managed Care – PPO

## 2022-04-07 ENCOUNTER — Encounter: Payer: BC Managed Care – PPO | Admitting: Internal Medicine

## 2022-05-05 ENCOUNTER — Encounter: Payer: BC Managed Care – PPO | Admitting: Internal Medicine

## 2022-07-26 ENCOUNTER — Encounter: Payer: BC Managed Care – PPO | Admitting: Internal Medicine

## 2022-10-07 ENCOUNTER — Encounter: Payer: Self-pay | Admitting: Podiatry

## 2022-10-07 ENCOUNTER — Ambulatory Visit: Payer: BC Managed Care – PPO | Admitting: Podiatry

## 2022-10-07 DIAGNOSIS — M2142 Flat foot [pes planus] (acquired), left foot: Secondary | ICD-10-CM

## 2022-10-07 DIAGNOSIS — M2141 Flat foot [pes planus] (acquired), right foot: Secondary | ICD-10-CM

## 2022-10-07 DIAGNOSIS — M19072 Primary osteoarthritis, left ankle and foot: Secondary | ICD-10-CM | POA: Diagnosis not present

## 2022-10-07 NOTE — Progress Notes (Signed)
Chief Complaint  Patient presents with   Pes planus    Sx consult-is now ready    Subjective:  47 y.o. male PMHx diabetes mellitus, last A1c 5.8 on 08/25/2020, presenting for follow-up evaluation of bilateral foot pain left greater than the right.    Brief history: Patient initially presented 12/10/2021 in our office for second opinion.  Patient states that he does have a history of flatfoot reconstruction in 2012.  Patient states that gradually over the past several years he is slowly developed increased pain and tenderness especially to the left foot.  It is very tender and painful on a daily basis.  He has tried orthotics and currently wears orthotics with minimal relief.  He has seen multiple physicians in the past who recommended surgery.  CT scans were performed of the feet 10/05/2018.  Patient states that he has had progressive increased pain now for several years.  Patient was last seen in the office 12/10/2021.  At that time surgical consent was obtained but he did not proceed with surgery at that time.  He would like to discuss surgery again and presenting for surgical consult.  Past Medical History:  Diagnosis Date   Allergy    Diabetes mellitus without complication (HCC)    no meds   Hyperlipidemia    Hypertension    Pemphigus    Plantar fasciitis    Past Surgical History:  Procedure Laterality Date   FINGER SURGERY     pinkie finger surgery to repair tendon   FOOT SURGERY Bilateral 01/31/2010   Arches   MOUTH SURGERY N/A 2015   Allergies  Allergen Reactions   Codeine     Codeine- Hallucinations   Hydrocodone-Acetaminophen     Visual hallucinations & "shakes"- Vicodin        Objective/Physical Exam General: The patient is alert and oriented x3 in no acute distress.  Dermatology: Skin is warm, dry and supple bilateral lower extremities. Negative for open lesions or macerations.  Vascular: Palpable pedal pulses bilaterally. No edema or erythema noted. Capillary  refill within normal limits.  Neurological: Grossly intact via light touch  Musculoskeletal Exam: Good range of motion to the ankle joint.  There is no pain on palpation to the ankle joint as well. Upon weightbearing there is a medial longitudinal arch collapse bilaterally. Rearfoot valgus noted to the bilateral lower extremities with excessive pronation upon mid stance.  Limited range of motion to the TN and subtalar joint especially the left foot.  There is also pain and tenderness with palpation and range of motion to the midfoot  Radiographic Exam 12/10/2021 B/L feet:  LT foot: Advanced degenerative changes noted throughout the midtarsal joint with collapse of the medial longitudinal arch and flatfoot deformity.  No acute fractures identified.  Subtalar arthroeresis implant noted.  Posterior aspect of the talus and tibiotalar joint also appear to be degenerative.  RT foot: Much less degenerative changes noted throughout the right foot compared to the contralateral limb.  No acute fractures identified.  Subtalar arthroeresis implant is also noted within the sinus tarsi which appears stable and intact.    CT B/L feet 10/05/2018 LT foot: IMPRESSION: 1. Moderate degenerative changes of the left hindfoot and midfoot, as detailed above. 2. Probable posterior tibialis tendinosis. 3. Small amount of fluid adjacent to the FHL mild tendinous junction, which may reflect a component of tenosynovitis.  RT foot: IMPRESSION: 1. Mild degenerative changes of the right hindfoot and midfoot, as detailed above. 2. Posterior tibialis tendinosis  with tenosynovitis.  Assessment: 1. pes planus bilateral 2.  Advanced DJD left foot 3.  Symptomatic hardware b/l feet. Arthroeresis implant 4. PSxHx bilateral foot surgery 2012. Gastroc lengthening w/ arthroeresis implant  Plan of Care:  1. Patient was evaluated. X-Rays reviewed again today.  2.  Patient continues to be very symptomatic to the left foot and has  pain and tenderness on a daily basis.  He says when he gets out of bed he has severe pain and tenderness which takes about 30 minutes to warm up.  When the foot warms up it does feel slightly better.  We reviewed that he is dealing with advanced arthritic changes especially in the left foot.  Given the advanced degenerative changes I do recommend surgery which would include triple arthrodesis of the left foot.  Patient has already undergone gastroc aponeurosis lengthening and I do not believe there is an equinus component to the patient's pathology currently.  Triple arthrodesis was explained in detail including the procedure, and postoperative recovery course which would include 8-12 weeks of strict nonweightbearing to the surgical extremity.  Patient understands.  Risk benefits advantages and disadvantages were explained.  No guarantees were expressed or implied.  All patient questions were answered.  The patient's spouse was also on the phone during our evaluation and all of her questions were answered as well. 3.  Today the patient would like to consent for surgical correction/triple arthrodesis of the left foot.  Authorization for surgery was reinitiated today.  Surgery will consist of triple arthrodesis left.  Removal of implant left. 4. Return to clinic 1 week postop  *Wife's name is Lewie Loron, DPM [facility]  Dr. Felecia Shelling, DPM    [address] [place], [place] [address]                Office [phone]  Fax [phone]

## 2022-11-28 ENCOUNTER — Other Ambulatory Visit: Payer: Self-pay | Admitting: Podiatry

## 2022-11-28 DIAGNOSIS — M19072 Primary osteoarthritis, left ankle and foot: Secondary | ICD-10-CM

## 2022-11-28 DIAGNOSIS — M2141 Flat foot [pes planus] (acquired), right foot: Secondary | ICD-10-CM

## 2022-12-13 ENCOUNTER — Telehealth: Payer: Self-pay | Admitting: Podiatry

## 2022-12-13 NOTE — Telephone Encounter (Signed)
DOS-01/12/2023  TRIPLE ARTHRODESIS [id number] REMOVAL HARDWARE [id number]  BCBS EFFECTIVE DATE-01/31/2022  DEDUCTIBLE - $1250.00 WITH REMAINING $1250.00 OOP-$4890.00 WITH REMAINING $6962.95 COINSURANCE - 20%  SPOKE WITH TRECIA W. FROM [employer] AND SHE STATED THAT PRIOR AUTH IS NOT REQUIRED FOR CPT CODES 28413 AND 20680. Marland Kitchen CALL REF #: TRECIA W. 12/13/2022 @2 :22 PM EST

## 2022-12-26 ENCOUNTER — Telehealth: Payer: Self-pay | Admitting: Podiatry

## 2022-12-26 NOTE — Telephone Encounter (Signed)
Completed FMLA documents for the patient -- e-mailed  them to his HR Efraim Kaufmann Faylen @ [facility]) ....       J. Abbott -- 12/16/2022

## 2022-12-27 ENCOUNTER — Telehealth: Payer: Self-pay | Admitting: Podiatry

## 2022-12-27 NOTE — Telephone Encounter (Signed)
Pt called in -- sd has STD documents to be completed ....  He e-mailed them to me .Marland Kitchen...   Advise will have e-mailed back to him prior to his surgery date ....     J. Abbott -- 12/27/2022

## 2023-01-03 ENCOUNTER — Telehealth: Payer: Self-pay | Admitting: Podiatry

## 2023-01-03 NOTE — Telephone Encounter (Signed)
Called patient & LMVM -- need signed auth to complete [facility] - have not received  (see prev note)   .Marland Kitchen...    J. Abbott -- 01/03/2023

## 2023-01-03 NOTE — Telephone Encounter (Signed)
(  01/02/2023) -- received [employer] STD docs from patient but no signed auth found ....   Called the patient and did a 3-way call with Corinda Gubler - they said there was not a signed auth on file so they would have to e-mail the auth form to patient for him to sign and return to them.  After that was received we would get a copy as well so docs could be completed ......    J. Abbott -- 01/03/2023

## 2023-01-04 ENCOUNTER — Telehealth: Payer: Self-pay | Admitting: Podiatry

## 2023-01-04 NOTE — Telephone Encounter (Signed)
Received signed [employer] authorization from Mr. Mathers.  Completed and faxed docs to [place]  [phone]) today ....   Also e-mailed them to [email or web address] per his request and called Mr. Delozier to advise same .Marland Kitchen...    J. Abbott -- 01/04/2023

## 2023-01-12 ENCOUNTER — Other Ambulatory Visit: Payer: Self-pay | Admitting: Podiatry

## 2023-01-12 DIAGNOSIS — M2142 Flat foot [pes planus] (acquired), left foot: Secondary | ICD-10-CM | POA: Diagnosis not present

## 2023-01-12 DIAGNOSIS — Z4889 Encounter for other specified surgical aftercare: Secondary | ICD-10-CM | POA: Diagnosis not present

## 2023-01-12 MED ORDER — OXYCODONE-ACETAMINOPHEN 10-325 MG PO TABS: 1.0000 | ORAL_TABLET | ORAL | 0 refills | Status: DC | PRN

## 2023-01-12 MED ORDER — IBUPROFEN 800 MG PO TABS: 800.0000 mg | ORAL_TABLET | Freq: Three times a day (TID) | ORAL | 1 refills | Status: DC

## 2023-01-12 NOTE — Progress Notes (Signed)
PRN postop 

## 2023-01-13 ENCOUNTER — Telehealth: Payer: Self-pay

## 2023-01-13 NOTE — Telephone Encounter (Signed)
Patient and his wife called, asking about the order for his knee scooter, placed in October. I faxed the order to [facility]. They are strictly retail, they do not file insurance - and insurance does not usually cover scooters  [facility] [place], [place] Phone [phone] Fax [phone]  Confirmation received

## 2023-01-16 ENCOUNTER — Telehealth: Payer: Self-pay | Admitting: Podiatry

## 2023-01-17 NOTE — Telephone Encounter (Signed)
NA

## 2023-01-20 ENCOUNTER — Ambulatory Visit (INDEPENDENT_AMBULATORY_CARE_PROVIDER_SITE_OTHER): Payer: BC Managed Care – PPO | Admitting: Podiatry

## 2023-01-20 ENCOUNTER — Ambulatory Visit (INDEPENDENT_AMBULATORY_CARE_PROVIDER_SITE_OTHER): Payer: BC Managed Care – PPO

## 2023-01-20 ENCOUNTER — Encounter: Payer: Self-pay | Admitting: Podiatry

## 2023-01-20 DIAGNOSIS — Z9889 Other specified postprocedural states: Secondary | ICD-10-CM | POA: Diagnosis not present

## 2023-01-20 DIAGNOSIS — M2142 Flat foot [pes planus] (acquired), left foot: Secondary | ICD-10-CM

## 2023-01-20 NOTE — Progress Notes (Signed)
   Chief Complaint  Patient presents with   Routine Post Op    POV #1, DOS 01/12/2023, TRIPLE ARTHRODESIS LEFT, REMOVAL OF HARDWARE LEFT FOOT "It hurts and it itches.  I have a pain level of about a seven right now."    Subjective:  Patient presents today status post triple arthrodesis with tendo Achilles lengthening left.  DOS: 01/12/2023.  Patient doing well.  Pain tolerable.  He has been strictly NWB in the cam boot with the assistance of the knee scooter.  No new complaints  Past Medical History:  Diagnosis Date   Allergy    Diabetes mellitus without complication (HCC)    no meds   Hyperlipidemia    Hypertension    Pemphigus    Plantar fasciitis     Past Surgical History:  Procedure Laterality Date   FINGER SURGERY     pinkie finger surgery to repair tendon   FOOT SURGERY Bilateral 01/31/2010   Arches   MOUTH SURGERY N/A 2015    Allergies  Allergen Reactions   Codeine     Codeine- Hallucinations   Hydrocodone-Acetaminophen     Visual hallucinations & "shakes"- Vicodin     Objective/Physical Exam Neurovascular status intact.  Incision well coapted with sutures intact. No sign of infectious process noted. No dehiscence. No active bleeding noted.  Moderate edema noted to the surgical extremity.  Overall the foot is in good rectus alignment with recreation of the medial longitudinal arch  Radiographic Exam LT foot 01/20/2023:  Orthopedic hardware and arthrodesis sites appear to be stable with routine healing.  Good recreation of the arch of the foot visualized on lateral view.  Overall well-healing surgical foot radiographically  Assessment: 1. s/p triple arthrodesis with tendo Achilles lengthening left. DOS: 01/12/2023   Plan of Care:  -Patient was evaluated. X-rays reviewed - Dressings changed.  Clean dry and intact x 1 week -Continue NWB CAM boot with the assistance of the knee scooter -Note for the patient's insurance was provided today explaining that over  the past 12 months we had pursued conservative treatment prior to the decision for surgery. -Return to clinic 1-2 weeks for suture and staple removal   Felecia Shelling, DPM [facility]  Dr. Felecia Shelling, DPM    [address] [place], [place] [address]                Office [phone]  Fax [phone]

## 2023-01-27 ENCOUNTER — Telehealth: Payer: Self-pay | Admitting: Podiatry

## 2023-01-27 ENCOUNTER — Encounter: Payer: Self-pay | Admitting: Podiatry

## 2023-01-27 ENCOUNTER — Ambulatory Visit (INDEPENDENT_AMBULATORY_CARE_PROVIDER_SITE_OTHER): Payer: BC Managed Care – PPO | Admitting: Podiatry

## 2023-01-27 VITALS — Ht 73.0 in | Wt 326.0 lb

## 2023-01-27 DIAGNOSIS — M2142 Flat foot [pes planus] (acquired), left foot: Secondary | ICD-10-CM

## 2023-01-27 MED ORDER — DOXYCYCLINE HYCLATE 100 MG PO TABS: 100.0000 mg | ORAL_TABLET | Freq: Two times a day (BID) | ORAL | 0 refills | Status: DC

## 2023-01-27 NOTE — Progress Notes (Signed)
   Chief Complaint  Patient presents with   Routine Post Op    Pt is here for second post op visit, pt states he is still having some pain, continues to wear cam boot and using scooter to get around.    Subjective:  Patient presents today status post triple arthrodesis with tendo Achilles lengthening left.  DOS: 01/12/2023.  Patient doing well.  Pain tolerable.  He has been strictly NWB in the cam boot with the assistance of the knee scooter.  No new complaints  Past Medical History:  Diagnosis Date   Allergy    Diabetes mellitus without complication (HCC)    no meds   Hyperlipidemia    Hypertension    Pemphigus    Plantar fasciitis     Past Surgical History:  Procedure Laterality Date   FINGER SURGERY     pinkie finger surgery to repair tendon   FOOT SURGERY Bilateral 01/31/2010   Arches   MOUTH SURGERY N/A 2015    Allergies  Allergen Reactions   Codeine     Codeine- Hallucinations   Hydrocodone-Acetaminophen     Visual hallucinations & "shakes"- Vicodin     Objective/Physical Exam Neurovascular status intact.  Incision well coapted with sutures intact.  There is some slight purulent serous drainage coming from the lateral incision site.  No malodor.  No surrounding erythema.  It does appear to be somewhat localized along the incision site.  There is some slight maceration at the incision site as well.  Radiographic Exam LT foot 01/20/2023:  Orthopedic hardware and arthrodesis sites appear to be stable with routine healing.  Good recreation of the arch of the foot visualized on lateral view.  Overall well-healing surgical foot radiographically  Assessment: 1. s/p triple arthrodesis with tendo Achilles lengthening left. DOS: 01/12/2023   Plan of Care:  -Patient was evaluated.  -Sutures and staples removed today -There was some scant purulent sanguinous drainage coming from the lateral incision site.  Cultures were taken of the drainage and sent to pathology for  culture and sensitivities -Prescription for doxycycline 100 mg 2 times daily #20 -Betadine wet-to-dry dressings were reapplied to the foot.  Leave clean dry and intact x 1 week -Continue strict NWB CAM boot with the knee scooter -Return to clinic 1 week   Felecia Shelling, DPM [facility]  Dr. Felecia Shelling, DPM    [address] [place], [place] [address]                Office [phone]  Fax [phone]

## 2023-01-27 NOTE — Telephone Encounter (Signed)
s/w Tyler Copeland today ....    He confirmed the 01/20/2023 letter from Dr. Logan Bores ....   He currently has [employer] until end of year ....  He will have [employer] for 2025 and will provide his new insurance card for his January appointment .Marland Kitchen...      J. Abbott -- 01/27/2023

## 2023-01-31 LAB — WOUND CULTURE

## 2023-02-03 ENCOUNTER — Telehealth: Payer: Self-pay | Admitting: Podiatry

## 2023-02-03 ENCOUNTER — Encounter: Payer: Self-pay | Admitting: Podiatry

## 2023-02-03 ENCOUNTER — Ambulatory Visit (INDEPENDENT_AMBULATORY_CARE_PROVIDER_SITE_OTHER): Payer: 59 | Admitting: Podiatry

## 2023-02-03 DIAGNOSIS — M2142 Flat foot [pes planus] (acquired), left foot: Secondary | ICD-10-CM

## 2023-02-03 MED ORDER — GENTAMICIN SULFATE 0.1 % EX CREA: 1.0000 | TOPICAL_CREAM | Freq: Two times a day (BID) | CUTANEOUS | 1 refills | Status: DC

## 2023-02-03 MED ORDER — DOXYCYCLINE HYCLATE 100 MG PO TABS: 100.0000 mg | ORAL_TABLET | Freq: Two times a day (BID) | ORAL | 0 refills | Status: DC

## 2023-02-03 NOTE — Telephone Encounter (Signed)
 Completed FMLA documents Tyler Copeland) and e-mailed to patient, per request, to [email or web address]>   ...   Called patient -- LMVM & advised same ....     J. Abbott -- 02/03/2023

## 2023-02-03 NOTE — Progress Notes (Signed)
 Chief Complaint  Patient presents with   Routine Post Op    DOS 01/12/2023, TRIPLE ARTHRODESIS LEFT, REMOVAL OF HARDWARE LEFT FOOT It's better.    Subjective:  Patient presents today status post triple arthrodesis with tendo Achilles lengthening left.  DOS: 01/12/2023.  Patient doing well.  Pain tolerable.  He has been strictly NWB in the cam boot with the assistance of the knee scooter.  No new complaints  Past Medical History:  Diagnosis Date   Allergy    Diabetes mellitus without complication (HCC)    no meds   Hyperlipidemia    Hypertension    Pemphigus    Plantar fasciitis     Past Surgical History:  Procedure Laterality Date   FINGER SURGERY     pinkie finger surgery to repair tendon   FOOT SURGERY Bilateral 01/31/2010   Arches   MOUTH SURGERY N/A 2015    Allergies  Allergen Reactions   Codeine     Codeine- Hallucinations   Hydrocodone-Acetaminophen      Visual hallucinations & shakes- Vicodin     LT foot 02/03/2023   Contains abnormal data WOUND CULTURE Order: [id number]  Status: Final result     Next appt: 02/10/2023 at 09:15 AM in [facility] [place] CHRISTELLA Sar, DPM)     Dx: Acquired pes planovalgus, left   Test Result Released: Yes (seen)   Specimen Information: Wound  Specimen Comment: FO  0 Result Notes    Component Ref Range & Units (hover) 7 d ago  Gram Stain Result Final report  Organism ID, Bacteria Comment  Comment: No white blood cells seen.  Organism ID, Bacteria Comment  Comment: Many gram negative rods.  Aerobic Bacterial Culture Final report Abnormal   Organism ID, Bacteria Pseudomonas stutzeri Abnormal   Comment: Light growth  Organism ID, Bacteria Not applicable  Antimicrobial Susceptibility Comment  Comment:       ** S = Susceptible; I = Intermediate; R = Resistant **                    P = Positive; N = Negative             MICS are expressed in micrograms per mL    Antibiotic                 RSLT#1    RSLT#2    RSLT#3     RSLT#4 Amikacin                       S Cefepime                       S Cefotaxime                     S Ceftazidime                    S Ceftriaxone                    S Ciprofloxacin                  S Gentamicin                      S Imipenem                       S Piperacillin  S Tetracycline                   S Ticarcillin                    S Tobramycin                     S  Resulting Agency [facility]      Objective/Physical Exam Neurovascular status intact.  Incision is nicely healed to the posterior ankle and medial incision.  Lateral incision does have dehiscence with some slight purulence.  No malodor.  No erythema around the wound.  Granular and fibrotic wound base.  No exposed hardware.  Please see above noted photo  Radiographic Exam LT foot 01/20/2023:  Orthopedic hardware and arthrodesis sites appear to be stable with routine healing.  Good recreation of the arch of the foot visualized on lateral view.  Overall well-healing surgical foot radiographically  Assessment: 1. s/p triple arthrodesis with tendo Achilles lengthening left. DOS: 01/12/2023   Plan of Care:  -Patient was evaluated.  - Culture results reviewed. -Refill prescription for doxycycline  100 mg BID x 10 days -Prescription for gentamicin  cream.  Apply every other day with a dressing.  This was demonstrated to the patient's family and they are comfortable dressing the foot every other day -Continue strict NWB in the cam boot with the knee scooter -Return to clinic 1 week  Thresa EMERSON Sar, DPM [facility]  Dr. Thresa EMERSON Sar, DPM    [address] [place], [place] [address]                Office [phone]  Fax [phone]

## 2023-02-10 ENCOUNTER — Ambulatory Visit (INDEPENDENT_AMBULATORY_CARE_PROVIDER_SITE_OTHER): Payer: 59

## 2023-02-10 ENCOUNTER — Encounter: Payer: Self-pay | Admitting: Podiatry

## 2023-02-10 ENCOUNTER — Ambulatory Visit (INDEPENDENT_AMBULATORY_CARE_PROVIDER_SITE_OTHER): Payer: 59 | Admitting: Podiatry

## 2023-02-10 DIAGNOSIS — Z9889 Other specified postprocedural states: Secondary | ICD-10-CM | POA: Diagnosis not present

## 2023-02-10 NOTE — Progress Notes (Signed)
 Chief Complaint  Patient presents with   Routine Post Op    It's sore.    Subjective:  Patient presents today status post triple arthrodesis with tendo Achilles lengthening left.  DOS: 01/12/2023.  Patient doing well.  Pain tolerable.  He has been strictly NWB in the cam boot with the assistance of the knee scooter.  The patient spouse has been applying gentamicin  cream and compressive wraps daily to the foot.  No new complaints  Past Medical History:  Diagnosis Date   Allergy    Diabetes mellitus without complication (HCC)    no meds   Hyperlipidemia    Hypertension    Pemphigus    Plantar fasciitis     Past Surgical History:  Procedure Laterality Date   FINGER SURGERY     pinkie finger surgery to repair tendon   FOOT SURGERY Bilateral 01/31/2010   Arches   MOUTH SURGERY N/A 2015    Allergies  Allergen Reactions   Codeine     Codeine- Hallucinations   Hydrocodone-Acetaminophen      Visual hallucinations & shakes- Vicodin     LT foot 02/03/2023   LT foot 02/10/2023   Contains abnormal data WOUND CULTURE Order: [id number]  Status: Final result     Next appt: 02/10/2023 at 09:15 AM in [facility] [place] CHRISTELLA Sar, DPM)     Dx: Acquired pes planovalgus, left   Test Result Released: Yes (seen)   Specimen Information: Wound  Specimen Comment: FO  0 Result Notes    Component Ref Range & Units (hover) 7 d ago  Gram Stain Result Final report  Organism ID, Bacteria Comment  Comment: No white blood cells seen.  Organism ID, Bacteria Comment  Comment: Many gram negative rods.  Aerobic Bacterial Culture Final report Abnormal   Organism ID, Bacteria Pseudomonas stutzeri Abnormal   Comment: Light growth  Organism ID, Bacteria Not applicable  Antimicrobial Susceptibility Comment  Comment:       ** S = Susceptible; I = Intermediate; R = Resistant **                    P = Positive; N = Negative             MICS are expressed in micrograms per mL    Antibiotic                  RSLT#1    RSLT#2    RSLT#3    RSLT#4 Amikacin                       S Cefepime                       S Cefotaxime                     S Ceftazidime                    S Ceftriaxone                    S Ciprofloxacin                  S Gentamicin                      S Imipenem  S Piperacillin                   S Tetracycline                   S Ticarcillin                    S Tobramycin                     S  Resulting Agency [facility]      Objective/Physical Exam Neurovascular status intact.  Incision is nicely healed to the posterior ankle and medial incision.  Significant improvement to the lateral incision site.  Please see the photo  Radiographic Exam LT foot 01/20/2023:  Orthopedic hardware and arthrodesis sites appear to be stable with routine healing.  Good recreation of the arch of the foot visualized on lateral view.  Overall well-healing surgical foot radiographically  Assessment: 1. s/p triple arthrodesis with tendo Achilles lengthening left. DOS: 01/12/2023   Plan of Care:  -Patient was evaluated.  -Overall patient is doing significantly better.  The incision site appears to be healing nicely -Continue doxycycline  100 mg BID x 10 days until completed -Continue gentamicin  cream with dressing.  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 Thresa EMERSON Sar, DPM    [address] [place], [place] [address]                Office [phone]  Fax [phone]

## 2023-03-07 ENCOUNTER — Ambulatory Visit (INDEPENDENT_AMBULATORY_CARE_PROVIDER_SITE_OTHER): Payer: 59

## 2023-03-07 ENCOUNTER — Telehealth: Payer: Self-pay | Admitting: Podiatry

## 2023-03-07 ENCOUNTER — Other Ambulatory Visit: Payer: Self-pay

## 2023-03-07 ENCOUNTER — Encounter: Payer: Self-pay | Admitting: Podiatry

## 2023-03-07 ENCOUNTER — Ambulatory Visit (INDEPENDENT_AMBULATORY_CARE_PROVIDER_SITE_OTHER): Payer: 59 | Admitting: Podiatry

## 2023-03-07 DIAGNOSIS — M2142 Flat foot [pes planus] (acquired), left foot: Secondary | ICD-10-CM

## 2023-03-07 DIAGNOSIS — Z9889 Other specified postprocedural states: Secondary | ICD-10-CM

## 2023-03-07 MED ORDER — OXYCODONE-ACETAMINOPHEN 5-325 MG PO TABS: 1.0000 | ORAL_TABLET | Freq: Three times a day (TID) | ORAL | 0 refills | Status: DC | PRN

## 2023-03-07 NOTE — Progress Notes (Signed)
   Chief Complaint  Patient presents with   Routine Post Op    POV  DOS 01/12/2023 triple arthrodesis with tendo Achilles lengthening left.  There's still some pain but it's a whole lot better than two months ago.  Every night, I get sharp pain in it.  I need a refill on my pain medication.  My pain level is a five.    Subjective:  Patient presents today status post triple arthrodesis with tendo Achilles lengthening left.  DOS: 01/12/2023.  Patient doing well.  He has been nonweightbearing as instructed using the knee scooter.  No new complaints.  He believes that the wound dehiscence portion of the incision site is completely healed and doing well.  He has not noticed any drainage for several weeks now.  Past Medical History:  Diagnosis Date   Allergy    Diabetes mellitus without complication (HCC)    no meds   Hyperlipidemia    Hypertension    Pemphigus    Plantar fasciitis     Past Surgical History:  Procedure Laterality Date   FINGER SURGERY     pinkie finger surgery to repair tendon   FOOT SURGERY Bilateral 01/31/2010   Arches   MOUTH SURGERY N/A 2015    Allergies  Allergen Reactions   Codeine     Codeine- Hallucinations   Hydrocodone-Acetaminophen      Visual hallucinations & shakes- Vicodin     Objective/Physical Exam Neurovascular status intact.  All incisions are nicely healed.  Minimal chronic edema noted.  No erythema or Eccrine indication of infection  Radiographic Exam LT foot 03/07/2023:  Orthopedic hardware and arthrodesis sites appear to be stable with routine healing.  Good recreation of the arch of the foot visualized on lateral view.  Overall well-healing surgical foot radiographically  Assessment: 1. s/p triple arthrodesis with tendo Achilles lengthening left. DOS: 01/12/2023   Plan of Care:  -Patient was evaluated.  - Patient continues to do well. -Note for work was provided for the patient today to return to work beginning 03/16/2023 with  restrictions -Patient may begin partial weightbearing in the cam walker.  Patient states that he will pick up walker today.  Slow transition to full weightbearing for the next 2-4 weeks. -Compression ankle sleeve dispensed.  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 Thresa EMERSON Sar, DPM    [address] [place], [place] [address]                Office [phone]  Fax [phone]

## 2023-03-07 NOTE — Telephone Encounter (Signed)
Patient would like a prescription for a walker. Thank you.

## 2023-03-07 NOTE — Telephone Encounter (Signed)
Pt called again asking for a rx for a walker please.

## 2023-05-02 ENCOUNTER — Ambulatory Visit (INDEPENDENT_AMBULATORY_CARE_PROVIDER_SITE_OTHER): Admitting: Podiatry

## 2023-05-02 ENCOUNTER — Ambulatory Visit: Payer: 59 | Admitting: Podiatry

## 2023-05-02 ENCOUNTER — Ambulatory Visit (INDEPENDENT_AMBULATORY_CARE_PROVIDER_SITE_OTHER)

## 2023-05-02 ENCOUNTER — Encounter: Payer: Self-pay | Admitting: Podiatry

## 2023-05-02 DIAGNOSIS — M19072 Primary osteoarthritis, left ankle and foot: Secondary | ICD-10-CM | POA: Diagnosis not present

## 2023-05-02 DIAGNOSIS — M2142 Flat foot [pes planus] (acquired), left foot: Secondary | ICD-10-CM

## 2023-05-02 NOTE — Progress Notes (Signed)
   Chief Complaint  Patient presents with   Routine Post Op    POV  DOS 01/12/2023 triple arthrodesis with tendo Achilles lengthening left.  "I been using my scooter at work.  It still feels like it's burning.  I have to find a way to elevate it at work sometime.  My pain level is about a four."    Subjective:  Patient presents today status post triple arthrodesis with tendo Achilles lengthening left.  DOS: 01/12/2023.  Patient continues to do well.  He has been WBAT in the cam boot as instructed.  He uses the knee scooter while at work for long distances  Past Medical History:  Diagnosis Date   Allergy    Diabetes mellitus without complication (HCC)    no meds   Hyperlipidemia    Hypertension    Pemphigus    Plantar fasciitis     Past Surgical History:  Procedure Laterality Date   FINGER SURGERY     pinkie finger surgery to repair tendon   FOOT SURGERY Bilateral 01/31/2010   Arches   MOUTH SURGERY N/A 2015    Allergies  Allergen Reactions   Codeine     Codeine- Hallucinations   Hydrocodone-Acetaminophen     Visual hallucinations & "shakes"- Vicodin     Objective/Physical Exam Neurovascular status intact.  All incisions are nicely healed.  No edema noted.  No tenderness with palpation.  Good stability and rigidness of the rear foot.  With weightbearing the heel is in good alignment with the leg.  No excessive valgus deformity noted with weightbearing  Radiographic Exam LT foot 05/02/2023:  Mostly unchanged with steady improvement.  Orthopedic hardware and arthrodesis sites appear to be stable with routine healing.  Good recreation of the arch of the foot visualized on lateral view.  Overall well-healing surgical foot radiographically  Assessment: 1. s/p triple arthrodesis with tendo Achilles lengthening left. DOS: 01/12/2023   Plan of Care:  -Patient was evaluated.  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 -Continue work restrictions for an additional 8 weeks -While at home the patient may begin to transition out of the cam boot into good supportive tennis shoes and sneakers -Return to clinic 8 weeks follow-up x-ray  Felecia Shelling, DPM [facility]  Dr. Felecia Shelling, DPM    [address] [place], [place] [address]                Office [phone]  Fax [phone]

## 2023-06-30 ENCOUNTER — Ambulatory Visit (INDEPENDENT_AMBULATORY_CARE_PROVIDER_SITE_OTHER)

## 2023-06-30 ENCOUNTER — Ambulatory Visit: Admitting: Podiatry

## 2023-06-30 ENCOUNTER — Encounter: Payer: Self-pay | Admitting: Podiatry

## 2023-06-30 DIAGNOSIS — M2142 Flat foot [pes planus] (acquired), left foot: Secondary | ICD-10-CM

## 2023-06-30 NOTE — Progress Notes (Signed)
   Chief Complaint  Patient presents with   Routine Post Op    POV  DOS 01/12/2023 triple arthrodesis with tendo Achilles lengthening left.     Subjective:  Patient presents today status post triple arthrodesis with tendo Achilles lengthening left.  DOS: 01/12/2023.  Doing well.  Currently he is full activity and sneakers.  He has some occasional numbness but no pain.  Past Medical History:  Diagnosis Date   Allergy    Diabetes mellitus without complication (HCC)    no meds   Hyperlipidemia    Hypertension    Pemphigus    Plantar fasciitis     Past Surgical History:  Procedure Laterality Date   FINGER SURGERY     pinkie finger surgery to repair tendon   FOOT SURGERY Bilateral 01/31/2010   Arches   MOUTH SURGERY N/A 2015    Allergies  Allergen Reactions   Codeine     Codeine- Hallucinations   Hydrocodone-Acetaminophen      Visual hallucinations & shakes- Vicodin     Objective/Physical Exam Neurovascular status intact.  All incisions are nicely healed.  No edema noted.  No tenderness with palpation.  Good stability and rigidness of the rear foot.  With weightbearing the heel is in good alignment with the leg.  No excessive valgus deformity noted with weightbearing  Radiographic Exam LT foot 06/30/2023:  Full arthrodesis noted.  No indication of delayed union or nonunion.  Orthopedic hardware and arthrodesis sites appear to be stable.  Good recreation of the arch of the foot visualized on lateral view.  Overall well-healing surgical foot radiographically  Assessment: 1. s/p triple arthrodesis with tendo Achilles lengthening left. DOS: 01/12/2023   Plan of Care:  -Patient was evaluated.  X-rays reviewed - Full activity no restrictions -Recommend good supportive sneakers and tennis shoes -Return to clinic PRN  Dot Gazella, DPM [facility]  Dr. Dot Gazella, DPM    [address] [place], [place] [address]                 Office [phone]  Fax [phone]

## 2023-07-04 ENCOUNTER — Ambulatory Visit: Admitting: Podiatry

## 2023-07-11 ENCOUNTER — Ambulatory Visit: Admitting: Podiatry

## 2023-08-01 LAB — PSA: PSA: 0.6

## 2023-08-28 ENCOUNTER — Ambulatory Visit (INDEPENDENT_AMBULATORY_CARE_PROVIDER_SITE_OTHER): Admitting: Internal Medicine

## 2023-08-28 ENCOUNTER — Ambulatory Visit (INDEPENDENT_AMBULATORY_CARE_PROVIDER_SITE_OTHER)

## 2023-08-28 ENCOUNTER — Ambulatory Visit: Payer: Self-pay | Admitting: Internal Medicine

## 2023-08-28 ENCOUNTER — Encounter: Payer: Self-pay | Admitting: Internal Medicine

## 2023-08-28 DIAGNOSIS — G8929 Other chronic pain: Secondary | ICD-10-CM

## 2023-08-28 DIAGNOSIS — Z6841 Body Mass Index (BMI) 40.0 and over, adult: Secondary | ICD-10-CM

## 2023-08-28 DIAGNOSIS — R7303 Prediabetes: Secondary | ICD-10-CM

## 2023-08-28 DIAGNOSIS — Z0000 Encounter for general adult medical examination without abnormal findings: Secondary | ICD-10-CM | POA: Diagnosis not present

## 2023-08-28 DIAGNOSIS — L2084 Intrinsic (allergic) eczema: Secondary | ICD-10-CM | POA: Insufficient documentation

## 2023-08-28 DIAGNOSIS — Z1159 Encounter for screening for other viral diseases: Secondary | ICD-10-CM | POA: Insufficient documentation

## 2023-08-28 DIAGNOSIS — I10 Essential (primary) hypertension: Secondary | ICD-10-CM

## 2023-08-28 DIAGNOSIS — Z114 Encounter for screening for human immunodeficiency virus [HIV]: Secondary | ICD-10-CM | POA: Insufficient documentation

## 2023-08-28 DIAGNOSIS — M25552 Pain in left hip: Secondary | ICD-10-CM

## 2023-08-28 DIAGNOSIS — Z13220 Encounter for screening for lipoid disorders: Secondary | ICD-10-CM

## 2023-08-28 DIAGNOSIS — M1612 Unilateral primary osteoarthritis, left hip: Secondary | ICD-10-CM | POA: Insufficient documentation

## 2023-08-28 LAB — BASIC METABOLIC PANEL WITH GFR
BUN: 18 mg/dL (ref 6–23)
CO2: 29 meq/L (ref 19–32)
Calcium: 9.8 mg/dL (ref 8.4–10.5)
Chloride: 103 meq/L (ref 96–112)
Creatinine, Ser: 1.04 mg/dL (ref 0.40–1.50)
GFR: 84.7 mL/min (ref >60.00)
Glucose, Bld: 93 mg/dL (ref 70–99)
Potassium: 4.1 meq/L (ref 3.5–5.1)
Sodium: 137 meq/L (ref 135–145)

## 2023-08-28 LAB — CBC WITH DIFFERENTIAL/PLATELET
Basophils Absolute: 0 K/uL (ref 0.0–0.1)
Basophils Relative: 0.4 % (ref 0.0–3.0)
Eosinophils Absolute: 0.2 K/uL (ref 0.0–0.7)
Eosinophils Relative: 3.1 % (ref 0.0–5.0)
HCT: 40.6 % (ref 39.0–52.0)
Hemoglobin: 12.8 g/dL — ABNORMAL LOW (ref 13.0–17.0)
Lymphocytes Relative: 32.7 % (ref 12.0–46.0)
Lymphs Abs: 1.6 K/uL (ref 0.7–4.0)
MCHC: 31.5 g/dL (ref 30.0–36.0)
MCV: 74.4 fl — ABNORMAL LOW (ref 78.0–100.0)
Monocytes Absolute: 1 K/uL (ref 0.1–1.0)
Monocytes Relative: 20.2 % — ABNORMAL HIGH (ref 3.0–12.0)
Neutro Abs: 2.2 K/uL (ref 1.4–7.7)
Neutrophils Relative %: 43.6 % (ref 43.0–77.0)
Platelets: 327 K/uL (ref 150.0–400.0)
RBC: 5.46 Mil/uL (ref 4.22–5.81)
RDW: 15.8 % — ABNORMAL HIGH (ref 11.5–15.5)
WBC: 5 K/uL (ref 4.0–10.5)

## 2023-08-28 LAB — HEPATIC FUNCTION PANEL
ALT: 16 U/L (ref 0–53)
AST: 14 U/L (ref 0–37)
Albumin: 4.2 g/dL (ref 3.5–5.2)
Alkaline Phosphatase: 69 U/L (ref 39–117)
Bilirubin, Direct: 0 mg/dL (ref 0.0–0.3)
Total Bilirubin: 0.3 mg/dL (ref 0.2–1.2)
Total Protein: 8.1 g/dL (ref 6.0–8.3)

## 2023-08-28 LAB — LIPID PANEL
Cholesterol: 154 mg/dL (ref 0–200)
HDL: 42.1 mg/dL (ref >39.00)
LDL Cholesterol: 91 mg/dL (ref 0–99)
NonHDL: 112.05
Total CHOL/HDL Ratio: 4
Triglycerides: 105 mg/dL (ref 0.0–149.0)
VLDL: 21 mg/dL (ref 0.0–40.0)

## 2023-08-28 LAB — POCT GLYCOSYLATED HEMOGLOBIN (HGB A1C): Hemoglobin A1C: 5.7 % — AB (ref 4.0–5.6)

## 2023-08-28 LAB — C-REACTIVE PROTEIN: CRP: 1.1 mg/dL (ref 0.5–20.0)

## 2023-08-28 MED ORDER — TRIAMCINOLONE ACETONIDE 0.1 % EX OINT
TOPICAL_OINTMENT | Freq: Two times a day (BID) | CUTANEOUS | 1 refills | Status: AC
Start: 2023-08-28 — End: ?

## 2023-08-28 NOTE — Progress Notes (Unsigned)
 Subjective:  Patient ID: Tyler Copeland, male    DOB: Feb 25, 1975  Age: 48 y.o. MRN: [id number]  CC: Annual Exam (Patient here with his results from [facility] which he wants to go over and maybe wanting to start medication. Patient mentions having tightness in left hip due from his foot surgery. He is wanting refill for Oxy and kenalog  ointment )   HPI Tyler Copeland presents for a CPX and f/up ---  Discussed the use of AI scribe software for clinical note transcription with the patient, who gave verbal consent to proceed.  History of Present Illness Tyler Copeland is a 48 year old male who presents with recent urological concerns and weight gain following foot surgery.  He underwent foot surgery in December and has been recovering since. Due to the recovery process, he has been less active, resulting in a weight gain of 21 pounds since May. He is attempting to increase his activity levels now that he is 'back out of boot'.  He has been experiencing urological symptoms, including pressure in his hip and microscopic hematuria. A CT scan and blood work were performed, both of which were normal, including PSA levels. He is not currently seeing blood in his urine and denies any other urinary symptoms.  No chest pain, shortness of breath, dizziness, or lightheadedness. He is not being treated for diabetes and denies symptoms such as excessive thirst, excessive urination, or drastic changes in weight or appetite, attributing his recent weight gain to reduced mobility post-surgery.  He experiences skin dryness, particularly on his back, and uses a cream or ointment to manage the condition. He is also taking Cialis, though the dosage and frequency are not specified.   He complains of limping and worsening left hip pain.  Outpatient Medications Prior to Visit  Medication Sig Dispense Refill   cetirizine  (ZYRTEC ) 10 MG tablet TAKE 1 TABLET (10 MG TOTAL) BY MOUTH DAILY. 30 tablet 10    Cholecalciferol  50 MCG (2000 UT) TABS Take 1 tablet (2,000 Units total) by mouth daily. 90 tablet 1   Cyanocobalamin (VITAMIN B-12) 1000 MCG/15ML LIQD Take by mouth.     ketoconazole  (NIZORAL ) 2 % shampoo Apply 1 Application topically 2 (two) times a week.     levocetirizine (XYZAL ) 5 MG tablet Take 1 tablet (5 mg total) by mouth 2 (two) times daily as needed for allergies (Can take an extra dose during flare ups.). 60 tablet 5   Multiple Vitamin (MULTIVITAMIN) tablet Take 1 tablet by mouth daily.     tadalafil (CIALIS) 5 MG tablet Take 5 mg by mouth daily.     alfuzosin (UROXATRAL) 10 MG 24 hr tablet Take 10 mg by mouth daily with breakfast.     gentamicin  cream (GARAMYCIN ) 0.1 % Apply 1 Application topically 2 (two) times daily. 30 g 1   ibuprofen  (ADVIL ) 800 MG tablet Take 1 tablet (800 mg total) by mouth 3 (three) times daily. 60 tablet 1   oxyCODONE -acetaminophen  (PERCOCET) 5-325 MG tablet Take 1 tablet by mouth every 8 (eight) hours as needed for severe pain (pain score 7-10). 28 tablet 0   triamcinolone  ointment (KENALOG ) 0.1 % Apply topically as directed.     albuterol  (VENTOLIN  HFA) 108 (90 Base) MCG/ACT inhaler Inhale 2 puffs into the lungs every 6 (six) hours as needed for wheezing or shortness of breath. 8 g 2   ARMOUR THYROID  30 MG tablet Take 30 mg by mouth daily.     budesonide -formoterol  (SYMBICORT ) 80-4.5 MCG/ACT inhaler Inhale  2 puffs into the lungs in the morning and at bedtime. 1 each 12   doxycycline  (VIBRA -TABS) 100 MG tablet Take 1 tablet (100 mg total) by mouth 2 (two) times daily. (Patient not taking: Reported on 08/28/2023) 20 tablet 0   guaiFENesin  (MUCINEX ) 600 MG 12 hr tablet Take 1 tablet (600 mg total) by mouth 2 (two) times daily as needed (Make sure you drink plenty of water.). (Patient not taking: Reported on 08/28/2023) 60 tablet 5   0.9 %  sodium chloride  infusion      No facility-administered medications prior to visit.    ROS Review of Systems   Constitutional:  Positive for unexpected weight change. Negative for appetite change, chills, diaphoresis and fatigue.  HENT: Negative.    Eyes: Negative.   Respiratory:  Negative for cough, chest tightness, shortness of breath and wheezing.   Cardiovascular:  Negative for chest pain, palpitations and leg swelling.  Gastrointestinal: Negative.  Negative for abdominal pain, constipation, diarrhea, nausea and vomiting.  Endocrine: Negative.   Genitourinary: Negative.  Negative for difficulty urinating.  Musculoskeletal:  Positive for arthralgias. Negative for myalgias and neck pain.  Skin:  Positive for rash.  Allergic/Immunologic: Negative.   Neurological: Negative.  Negative for dizziness and weakness.  Hematological:  Negative for adenopathy. Does not bruise/bleed easily.  Psychiatric/Behavioral: Negative.      Objective:  BP 124/78 (BP Location: Left Arm, Patient Position: Sitting, Cuff Size: Normal)   Pulse 86   Temp 98.5 F (36.9 C) (Oral)   Ht 6' 1 (1.854 m)   Wt (!) 347 lb (157.4 kg)   SpO2 96%   BMI 45.78 kg/m   BP Readings from Last 3 Encounters:  08/28/23 124/78  01/21/21 110/68  01/14/21 126/80    Wt Readings from Last 3 Encounters:  08/28/23 (!) 347 lb (157.4 kg)  06/30/23 (!) 326 lb (147.9 kg)  01/27/23 (!) 326 lb (147.9 kg)    Physical Exam Constitutional:      General: He is not in acute distress.    Appearance: He is not toxic-appearing or diaphoretic.  HENT:     Nose: Nose normal.     Mouth/Throat:     Mouth: Mucous membranes are moist.  Eyes:     General: No scleral icterus.    Conjunctiva/sclera: Conjunctivae normal.  Cardiovascular:     Rate and Rhythm: Normal rate and regular rhythm.     Heart sounds: No murmur heard.    No friction rub. No gallop.  Pulmonary:     Effort: Pulmonary effort is normal.     Breath sounds: No stridor. No wheezing, rhonchi or rales.  Abdominal:     General: Abdomen is protuberant. Bowel sounds are normal.  There is no distension.     Palpations: There is no hepatomegaly, splenomegaly or mass.     Tenderness: There is no abdominal tenderness. There is no guarding.     Hernia: No hernia is present.  Musculoskeletal:        General: Normal range of motion.     Cervical back: Neck supple.     Right lower leg: No edema.     Left lower leg: No edema.  Lymphadenopathy:     Cervical: No cervical adenopathy.  Skin:    General: Skin is warm and dry.  Neurological:     General: No focal deficit present.     Mental Status: He is alert. Mental status is at baseline.  Psychiatric:  Mood and Affect: Mood normal.        Behavior: Behavior normal.     Lab Results  Component Value Date   WBC 4.7 08/25/2020   HGB 12.7 (L) 08/25/2020   HCT 40.3 08/25/2020   PLT 267.0 08/25/2020   GLUCOSE 75 08/25/2020   CHOL 156 08/25/2020   TRIG 96.0 08/25/2020   HDL 44.70 08/25/2020   LDLDIRECT 163.8 07/20/2010   LDLCALC 92 08/25/2020   ALT 32 08/25/2020   AST 25 08/25/2020   NA 139 08/25/2020   K 4.0 08/25/2020   CL 103 08/25/2020   CREATININE 0.85 08/25/2020   BUN 16 08/25/2020   CO2 30 08/25/2020   TSH 0.96 08/06/2020   PSA 0.6 08/01/2023   HGBA1C 5.7 (A) 08/28/2023    CT FOOT LEFT WO CONTRAST Result Date: 10/06/2018 CLINICAL DATA:  Chronic left foot pain EXAM: CT OF THE LEFT FOOT WITHOUT CONTRAST TECHNIQUE: Multidetector CT imaging of the left foot was performed according to the standard protocol. Multiplanar CT image reconstructions were also generated. COMPARISON:  X-ray 08/06/2018, MRI 08/10/2018 FINDINGS: Bones/Joint/Cartilage No acute fracture. No malalignment. Tibiotalar joint space is maintained. Minimal degenerative spurring within the inferior aspects of the medial and lateral gutters. Osseous manifestations of subfibular impingement. No talar dome osteochondral lesion is evident. Mild anterior osteophytosis of the tibiotalar joint. Moderate to severe degenerative changes involving the  anterior and middle subtalar joints. Mild degenerative spurring along the posterior aspect of the posterior subtalar joint. There is a prominent os trigonum. Small distal Achilles enthesophyte. Minimal degenerative changes at the calcaneocuboid joint. Marked dorsal hypertrophy and irregularity of the talonavicular joint. Surgical implant again noted within the sinus tarsi. Multiple small ossific fragments adjacent to the anterior process of the calcaneus, which may be sequela of prior avulsion injury. Small os peroneum and os navicular. Tarsometatarsal joint alignment is maintained. Minimal joint space narrowing across the TMT joints. Small well ossified body at the dorsal aspect of the second TMT joint. Metatarsophalangeal and interphalangeal joint spaces are relatively preserved. No appreciable tibiotalar or subtalar joint effusion. Ligaments Suboptimally assessed by CT. Muscles and Tendons No significant muscular atrophy or fatty infiltration. Mild fluid adjacent to the flexor hallucis longus at the level of the myotendinous junction. Posterior tibialis tendon appears somewhat thickened and ill-defined as it traverses the medial malleolus. The remaining visualized tendons appear grossly intact. Soft tissues No additional soft tissue abnormality. IMPRESSION: 1. Moderate degenerative changes of the left hindfoot and midfoot, as detailed above. 2. Probable posterior tibialis tendinosis. 3. Small amount of fluid adjacent to the FHL mild tendinous junction, which may reflect a component of tenosynovitis. Electronically Signed   By: Tyler Copeland M.D.   On: 10/06/2018 09:35   CT FOOT RIGHT WO CONTRAST Result Date: 10/06/2018 CLINICAL DATA:  Chronic foot pain EXAM: CT OF THE RIGHT FOOT WITHOUT CONTRAST TECHNIQUE: Multidetector CT imaging of the right foot was performed according to the standard protocol. Multiplanar CT image reconstructions were also generated. COMPARISON:  MRI 08/10/2018.  X-ray 08/06/2018  FINDINGS: Bones/Joint/Cartilage No acute fracture. No malalignment. Tibiotalar joint space is maintained. Minimal degenerative spurring within the inferior aspects of the medial and lateral gutters. No talar dome osteochondral lesion is evident. Mild anterior osteophytosis of the tibiotalar joint. Mild degenerative changes involving the anterior and middle subtalar joints. The posterior subtalar joint space is maintained. There is a prominent os trigonum. Small bidirectional calcaneal enthesophytes. Minimal degenerative changes at the calcaneocuboid and talonavicular joints within the midfoot. Mild talonavicular  dorsal hypertrophy. Surgical implant again noted within the sinus tarsi. 1.3 x 0.6 cm ossification adjacent to the hardware along its anterolateral margin, which may reflect sequela of remote trauma. Small os peroneum. Tarsometatarsal joint alignment is maintained. Minimal joint space narrowing across the TMT joints. Small well ossified body at the dorsal aspect of the second TMT joint. Metatarsophalangeal and interphalangeal joint spaces are relatively preserved. No tibiotalar or subtalar joint effusion. Ligaments Suboptimally assessed by CT. Muscles and Tendons No muscular atrophy or fatty infiltration is evident. Thickened, ill-defined appearance of the posterior tibialis tendon with fluid distending the tendon sheath (series 4, image 36). The remaining visualized tendons appear within normal limits. Soft tissues Remaining soft tissues are within normal limits. IMPRESSION: 1. Mild degenerative changes of the right hindfoot and midfoot, as detailed above. 2. Posterior tibialis tendinosis with tenosynovitis. Electronically Signed   By: Tyler Copeland M.D.   On: 10/06/2018 09:28   DG HIP UNILAT W OR W/O PELVIS 2-3 VIEWS LEFT Result Date: 08/28/2023 CLINICAL DATA:  Left hip pain for 2 months EXAM: DG HIP (WITH OR WITHOUT PELVIS) 2-3V LEFT COMPARISON:  None Available. FINDINGS: Mild degenerative  craniocaudad and axial chondral thinning in the left hip. Mild to moderate spurring of the left femoral head and acetabulum. 3.5 cm corticated and chronically fragmented spur of the left upper acetabulum. 2.1 cm ossific structure posterior to the inter trochanteric portion of the proximal femur, probably a small focus of heterotopic ossification. No acute fracture or acute bony findings. IMPRESSION: 1. Mild to moderate degenerative arthropathy of the left hip. 2. 3.5 cm corticated and chronically fragmented spur of the left upper acetabulum. 3. 2.1 cm ossific structure posterior to the intertrochanteric portion of the proximal femur, probably a small focus of heterotopic ossification. Electronically Signed   By: Tyler Copeland M.D.   On: 08/28/2023 11:33     Assessment & Plan:  Morbid obesity (HCC) -     TSH; Future  Prediabetes -     POCT glycosylated hemoglobin (Hb A1C) -     Basic metabolic panel with GFR; Future  Essential hypertension, benign -     CBC with Differential/Platelet; Future -     Hepatic function panel; Future  Need for hepatitis C screening test -     Hepatitis C antibody; Future  Encounter for screening for HIV -     HIV Antibody (routine testing w rflx); Future  Routine general medical examination at a health care facility -     Hepatitis C antibody; Future -     HIV Antibody (routine testing w rflx); Future -     Lipid panel; Future  Chronic left hip pain -     C-reactive protein; Future -     DG HIP UNILAT W OR W/O PELVIS 2-3 VIEWS LEFT; Future  Intrinsic eczema -     Triamcinolone  Acetonide; Apply topically 2 (two) times daily.  Dispense: 80 g; Refill: 1  Unilateral primary osteoarthritis, left hip -     Ambulatory referral to Orthopedic Surgery     Follow-up: Return in about 6 months (around 02/28/2024).  Debby Molt, MD

## 2023-08-28 NOTE — Patient Instructions (Signed)

## 2023-08-29 LAB — TSH: TSH: 1.88 u[IU]/mL (ref 0.35–5.50)

## 2023-08-29 LAB — HIV ANTIBODY (ROUTINE TESTING W REFLEX): HIV 1&2 Ab, 4th Generation: NONREACTIVE

## 2023-08-29 LAB — HEPATITIS C ANTIBODY: Hepatitis C Ab: NONREACTIVE

## 2023-08-29 NOTE — Telephone Encounter (Signed)
 Copied from CRM [id number]. Topic: Clinical - Lab/Test Results >> Aug 29, 2023  3:17 PM [place] wrote: Reason for CRM: Patient asking for a nurse to contact him to go over test results and imaging

## 2023-08-30 ENCOUNTER — Telehealth: Payer: Self-pay

## 2023-08-30 ENCOUNTER — Encounter (INDEPENDENT_AMBULATORY_CARE_PROVIDER_SITE_OTHER): Payer: Self-pay

## 2023-08-30 NOTE — Telephone Encounter (Signed)
 Patient wants to know what the RDW means on his blood work and is that something that he needs to be worry about.   Patient wants to know if you can send him a muscle relaxer for the pain he's having in his hip. He states that he once had Cyclobenzaprine 5 mg.

## 2023-08-31 ENCOUNTER — Telehealth: Payer: Self-pay

## 2023-08-31 ENCOUNTER — Other Ambulatory Visit: Payer: Self-pay | Admitting: Internal Medicine

## 2023-08-31 ENCOUNTER — Other Ambulatory Visit (HOSPITAL_COMMUNITY): Payer: Self-pay

## 2023-08-31 DIAGNOSIS — M1612 Unilateral primary osteoarthritis, left hip: Secondary | ICD-10-CM

## 2023-08-31 MED ORDER — TRAMADOL HCL ER 100 MG PO TB24: 100.0000 mg | ORAL_TABLET | Freq: Every day | ORAL | 1 refills | Status: AC

## 2023-08-31 NOTE — Telephone Encounter (Signed)
 Pharmacy Patient Advocate Encounter   Received notification from CoverMyMeds that prior authorization for traMADol  HCl ER 100MG  er tablets is required/requested.   Insurance verification completed.   The patient is insured through [facility] .   Per test claim: PA required; PA submitted to above mentioned insurance via CoverMyMeds Key/confirmation #/[facility] Status is pending

## 2023-08-31 NOTE — Telephone Encounter (Signed)
 Patient has been made aware and gave a verbal understanding.

## 2023-08-31 NOTE — Telephone Encounter (Signed)
 Pharmacy Patient Advocate Encounter  Received notification from [facility] that Prior Authorization for traMADol  HCl ER 100MG  er tablets has been APPROVED from 08/31/23 to 03/02/24   PA #/Case ID/Reference #: [id number]

## 2023-09-01 MED ORDER — ETODOLAC 200 MG PO CAPS: 200.0000 mg | ORAL_CAPSULE | Freq: Three times a day (TID) | ORAL | 0 refills | Status: AC

## 2023-09-01 NOTE — Addendum Note (Signed)
 Addended by: JOSHUA DEBBY CROME on: 09/01/2023 08:34 AM   Modules accepted: Orders

## 2023-10-16 ENCOUNTER — Institutional Professional Consult (permissible substitution) (INDEPENDENT_AMBULATORY_CARE_PROVIDER_SITE_OTHER): Admitting: Internal Medicine

## 2023-10-20 ENCOUNTER — Ambulatory Visit: Admitting: Physician Assistant

## 2023-10-31 ENCOUNTER — Ambulatory Visit: Admitting: Physician Assistant

## 2023-12-04 ENCOUNTER — Encounter: Payer: Self-pay | Admitting: Radiology

## 2023-12-12 ENCOUNTER — Ambulatory Visit: Admitting: Physician Assistant

## 2024-01-23 ENCOUNTER — Ambulatory Visit: Admitting: Physician Assistant

## 2024-02-02 ENCOUNTER — Ambulatory Visit: Admitting: Physician Assistant

## 2024-02-28 ENCOUNTER — Ambulatory Visit: Admitting: Physician Assistant

## 2024-02-29 ENCOUNTER — Ambulatory Visit: Admitting: Physician Assistant

## 2024-03-29 ENCOUNTER — Ambulatory Visit: Admitting: Physician Assistant
